# Patient Record
Sex: Female | Born: 2000 | Race: Black or African American | Hispanic: No | Marital: Single | Smoking: Former smoker
Health system: Southern US, Community
[De-identification: ages and names within clinical notes are randomized; demographics above are authoritative.]

## PROBLEM LIST (undated history)

## (undated) DIAGNOSIS — J45909 Unspecified asthma, uncomplicated: Secondary | ICD-10-CM

## (undated) DIAGNOSIS — B009 Herpesviral infection, unspecified: Secondary | ICD-10-CM

## (undated) HISTORY — PX: NO PAST SURGERIES: SHX2092

## (undated) HISTORY — DX: Unspecified asthma, uncomplicated: J45.909

## (undated) HISTORY — DX: Herpesviral infection, unspecified: B00.9

---

## 2019-11-11 ENCOUNTER — Telehealth: Payer: Self-pay | Admitting: Adult Health

## 2019-11-11 NOTE — Telephone Encounter (Signed)
Called patient regarding appointment and the following message was left: ° ° °We have you scheduled for an upcoming appointment at our office. At this time, we are still not allowing visitors during the appointment, however, a support person, over age 20, may accompany you to your appointment if assistance is needed for safety or care concerns. Otherwise, support persons should remain outside until the visit is complete.  ° °We ask if you are sick, have any symptoms of COVID, have had any exposure to anyone suspected or confirmed of having COVID-19, or are awaiting test results for COVID-19, to call our office as we may need to reschedule you for a virtual visit or schedule your appointment for a later date.   ° °Please know we will ask you these questions or similar questions when you arrive for your appointment and understand this is how we are keeping everyone safe.   ° °Also,to keep you safe, please use the provided hand sanitizer when you enter the office. We are asking everyone in the office to wear a mask to help prevent the spread of °germs. If you have a mask of your own, please wear it to your appointment, if not, we are happy to provide one for you. ° °Thank you for understanding and your cooperation.  ° ° °[facility] Staff ° ° ° ° ° °

## 2019-11-12 ENCOUNTER — Ambulatory Visit (INDEPENDENT_AMBULATORY_CARE_PROVIDER_SITE_OTHER): Payer: Medicaid Other | Admitting: Adult Health

## 2019-11-12 ENCOUNTER — Encounter: Payer: Self-pay | Admitting: Adult Health

## 2019-11-12 ENCOUNTER — Other Ambulatory Visit (HOSPITAL_COMMUNITY)
Admission: RE | Admit: 2019-11-12 | Discharge: 2019-11-12 | Disposition: A | Discharge status: Home / Self Care | Payer: Medicaid Other | Source: Ambulatory Visit | Attending: Adult Health | Admitting: Adult Health

## 2019-11-12 ENCOUNTER — Other Ambulatory Visit: Payer: Self-pay

## 2019-11-12 VITALS — BP 120/77 | HR 83 | Ht 62.0 in | Wt 148.0 lb

## 2019-11-12 DIAGNOSIS — Z113 Encounter for screening for infections with a predominantly sexual mode of transmission: Secondary | ICD-10-CM | POA: Diagnosis not present

## 2019-11-12 DIAGNOSIS — N920 Excessive and frequent menstruation with regular cycle: Secondary | ICD-10-CM | POA: Diagnosis not present

## 2019-11-12 DIAGNOSIS — Z7689 Persons encountering health services in other specified circumstances: Secondary | ICD-10-CM | POA: Insufficient documentation

## 2019-11-12 MED ORDER — LO LOESTRIN FE 1 MG-10 MCG / 10 MCG PO TABS: 1.0000 | ORAL_TABLET | Freq: Every day | ORAL | 11 refills | Status: DC

## 2019-11-12 NOTE — Progress Notes (Signed)
Patient ID: Jackie Brennan, female   DOB: 14-Oct-2001, 19 y.o.   MRN: [id number] History of Present Illness: Jackie Brennan is a 19 year old black female, single,G0P0 in requesting STD testing.And mentioned periods heavy     Current Medications, Allergies, Past Medical History, Past Surgical History, Family History and Social History were reviewed in [employer] record.     Review of Systems: Patient denies any headaches, hearing loss, fatigue, blurred vision, shortness of breath, chest pain, abdominal pain, problems with bowel movements, urination, or intercourse. No joint pain or mood swings. Has heavy periods may change every hour and lasts 7 days and has some mild cramps but not bad.     Physical Exam:BP 120/77 (BP Location: Left Arm, Patient Position: Sitting, Cuff Size: Normal)   Pulse 83   Ht 5\' 2"  (1.575 m)   Wt 148 lb (67.1 kg)   LMP 10/25/2019   BMI 27.07 kg/m  General:  Well developed, well nourished, no acute distress Skin:  Warm and dry Neck:  Midline trachea, normal thyroid, good ROM, no lymphadenopathy Lungs; Clear to auscultation bilaterally Cardiovascular: Regular rate and rhythm Pelvic:  External genitalia is normal in appearance, no lesions.  The vagina is normal in appearance. Urethra has no lesions or masses. The cervix is smooth.  Uterus is felt to be normal size, shape, and contour.  No adnexal masses or tenderness noted.Bladder is non tender, no masses felt.CV swab obtained. Extremities/musculoskeletal:  No swelling or varicosities noted, no clubbing or cyanosis Psych:  No mood changes, alert and cooperative,seems happy Fall risk is low PHQ 2 score is 0. Examination chaperoned by 10/27/2019 LPN  Impression and Plan: 1. Screening examination for STD (sexually transmitted disease) CV swab sent for GC/CHL and trich Check HIV and RPR  2. Menorrhagia with regular cycle   3. Encounter for menstrual regulation will rx OCs, start with next  period and use condoms Meds ordered this encounter  Medications  . Norethindrone-Ethinyl Estradiol-Fe Biphas (LO LOESTRIN FE) 1 MG-10 MCG / 10 MCG tablet    Sig: Take 1 tablet by mouth daily. Take 1 daily by mouth    Dispense:  1 Package    Refill:  11    BIN Malachy Mood, PCN CN, GRP [id number]    Order Specific Question:   Supervising Provider    Answer:   [id number]  Follow up in 3 months for ROS

## 2019-11-13 LAB — RPR: RPR Ser Ql: NONREACTIVE

## 2019-11-13 LAB — HIV ANTIBODY (ROUTINE TESTING W REFLEX): HIV Screen 4th Generation wRfx: NONREACTIVE

## 2019-11-16 ENCOUNTER — Telehealth: Payer: Self-pay | Admitting: *Deleted

## 2019-11-16 LAB — CERVICOVAGINAL ANCILLARY ONLY
Chlamydia: NEGATIVE
Comment: NEGATIVE
Comment: NEGATIVE
Comment: NORMAL
Neisseria Gonorrhea: NEGATIVE
Trichomonas: NEGATIVE

## 2019-11-16 NOTE — Telephone Encounter (Signed)
LMOVM that HIV and RPR both negative.

## 2019-11-17 ENCOUNTER — Telehealth: Payer: Self-pay | Admitting: *Deleted

## 2019-11-17 NOTE — Telephone Encounter (Signed)
Left message letting pt know her CV swab was negative. JSY

## 2019-11-17 NOTE — Telephone Encounter (Signed)
-----   Message from Adline Potter, NP sent at 11/17/2019  8:23 AM EST ----- Let pt know CV swab negative

## 2020-02-05 ENCOUNTER — Encounter: Payer: Self-pay | Admitting: Emergency Medicine

## 2020-02-05 ENCOUNTER — Emergency Department
Admission: EM | Admit: 2020-02-05 | Discharge: 2020-02-05 | Disposition: A | Discharge status: Home / Self Care | Payer: Medicaid Other | Attending: Emergency Medicine | Admitting: Emergency Medicine

## 2020-02-05 ENCOUNTER — Other Ambulatory Visit: Payer: Self-pay

## 2020-02-05 DIAGNOSIS — R1013 Epigastric pain: Secondary | ICD-10-CM | POA: Insufficient documentation

## 2020-02-05 DIAGNOSIS — R1084 Generalized abdominal pain: Secondary | ICD-10-CM | POA: Diagnosis present

## 2020-02-05 LAB — COMPREHENSIVE METABOLIC PANEL
ALT: 14 U/L (ref 0–44)
AST: 18 U/L (ref 15–41)
Albumin: 3.8 g/dL (ref 3.5–5.0)
Alkaline Phosphatase: 67 U/L (ref 38–126)
Anion gap: 6 (ref 5–15)
BUN: 15 mg/dL (ref 6–20)
CO2: 26 mmol/L (ref 22–32)
Calcium: 9 mg/dL (ref 8.9–10.3)
Chloride: 104 mmol/L (ref 98–111)
Creatinine, Ser: 0.78 mg/dL (ref 0.44–1.00)
GFR calc Af Amer: >60 mL/min (ref >60)
GFR calc non Af Amer: >60 mL/min (ref >60)
Glucose, Bld: 93 mg/dL (ref 70–99)
Potassium: 4.1 mmol/L (ref 3.5–5.1)
Sodium: 136 mmol/L (ref 135–145)
Total Bilirubin: 0.7 mg/dL (ref 0.3–1.2)
Total Protein: 8 g/dL (ref 6.5–8.1)

## 2020-02-05 LAB — URINALYSIS, COMPLETE (UACMP) WITH MICROSCOPIC
Bacteria, UA: NONE SEEN
Bilirubin Urine: NEGATIVE
Glucose, UA: NEGATIVE mg/dL
Hgb urine dipstick: NEGATIVE
Ketones, ur: NEGATIVE mg/dL
Leukocytes,Ua: NEGATIVE
Nitrite: NEGATIVE
Protein, ur: 30 mg/dL — AB
Specific Gravity, Urine: 1.024 (ref 1.005–1.030)
pH: 9 — ABNORMAL HIGH (ref 5.0–8.0)

## 2020-02-05 LAB — CBC
HCT: 38 % (ref 36.0–46.0)
Hemoglobin: 12.2 g/dL (ref 12.0–15.0)
MCH: 27.5 pg (ref 26.0–34.0)
MCHC: 32.1 g/dL (ref 30.0–36.0)
MCV: 85.6 fL (ref 80.0–100.0)
Platelets: 261 10*3/uL (ref 150–400)
RBC: 4.44 MIL/uL (ref 3.87–5.11)
RDW: 13.6 % (ref 11.5–15.5)
WBC: 5.8 10*3/uL (ref 4.0–10.5)
nRBC: 0 % (ref 0.0–0.2)

## 2020-02-05 LAB — POCT PREGNANCY, URINE: Preg Test, Ur: NEGATIVE

## 2020-02-05 LAB — LIPASE, BLOOD: Lipase: 43 U/L (ref 11–51)

## 2020-02-05 MED ORDER — DICYCLOMINE HCL 10 MG PO CAPS: 10.0000 mg | ORAL_CAPSULE | Freq: Four times a day (QID) | ORAL | 0 refills | Status: DC

## 2020-02-05 MED ORDER — DICYCLOMINE HCL 10 MG PO CAPS
10.0000 mg | ORAL_CAPSULE | Freq: Once | ORAL | Status: AC
  Administered 2020-02-05: 10 mg via ORAL
  Filled 2020-02-05: qty 1

## 2020-02-05 MED ORDER — ONDANSETRON 4 MG PO TBDP
4.0000 mg | ORAL_TABLET | Freq: Once | ORAL | Status: AC
  Administered 2020-02-05: 4 mg via ORAL
  Filled 2020-02-05: qty 1

## 2020-02-05 MED ORDER — SODIUM CHLORIDE 0.9% FLUSH: 3.0000 mL | Freq: Once | INTRAVENOUS | Status: DC

## 2020-02-05 MED ORDER — ONDANSETRON HCL 4 MG PO TABS: 4.0000 mg | ORAL_TABLET | Freq: Three times a day (TID) | ORAL | 0 refills | Status: DC | PRN

## 2020-02-05 NOTE — ED Provider Notes (Signed)
Emergency Department Provider Note  ____________________________________________  Time seen: Approximately 5:45 PM  I have reviewed the triage vital signs and the nursing notes.   HISTORY  Chief Complaint Abdominal Pain   Historian Patient     HPI Jackie Brennan is a 19 y.o. female with an unremarkable past medical history presents to the emergency department with generalized abdominal pain that has occurred over the past several months.  Patient states that she has had one episode of emesis earlier in the week and has had diarrhea over the past several months but not consistently.  Last bowel movement was this morning and was normal.  She denies fever or chills at home.  No dysuria, hematuria or increased urinary frequency.  No pain in her low back.  She states that when pain originally started, it would be worse after eating.  She denies excessive use of alcohol or NSAIDs.  She denies a history of GI issues in childhood.  She states that the pain is episodic and does seem to come and go.  She denies dyspareunia or pelvic pain.   Past Medical History:  Diagnosis Date  . Herpes simplex virus (HSV) infection      Immunizations up to date:  Yes.     Past Medical History:  Diagnosis Date  . Herpes simplex virus (HSV) infection     Patient Active Problem List   Diagnosis Date Noted  . Screening examination for STD (sexually transmitted disease) 11/12/2019  . Menorrhagia with regular cycle 11/12/2019  . Encounter for menstrual regulation 11/12/2019    History reviewed. No pertinent surgical history.  Prior to Admission medications   Medication Sig Start Date End Date Taking? Authorizing Provider  dicyclomine (BENTYL) 10 MG capsule Take 1 capsule (10 mg total) by mouth 4 (four) times daily for 14 days. 02/05/20 02/19/20  Orvil Feil, PA-C  Norethindrone-Ethinyl Estradiol-Fe Biphas (LO LOESTRIN FE) 1 MG-10 MCG / 10 MCG tablet Take 1 tablet by mouth daily. Take 1 daily  by mouth 11/12/19   Adline Potter, NP  ondansetron (ZOFRAN) 4 MG tablet Take 1 tablet (4 mg total) by mouth every 8 (eight) hours as needed for nausea or vomiting. 02/05/20   Orvil Feil, PA-C  valACYclovir (VALTREX) 500 MG tablet Take 500 mg by mouth daily.  06/08/19   [provider]    Allergies Patient has no known allergies.  Family History  Problem Relation Age of Onset  . Diabetes Paternal Grandmother   . Arthritis Paternal Grandmother   . Diabetes Maternal Grandmother        borderline  . Arthritis Maternal Grandfather   . Diabetes Father     Social History Social History   Tobacco Use  . Smoking status: Never Smoker  . Smokeless tobacco: Never Used  Substance Use Topics  . Alcohol use: Never  . Drug use: Never     Review of Systems  Constitutional: No fever/chills Eyes:  No discharge ENT: No upper respiratory complaints. Respiratory: no cough. No SOB/ use of accessory muscles to breath Gastrointestinal:  Patient has generalized abdominal pain.  Musculoskeletal: Negative for musculoskeletal pain. Skin: Negative for rash, abrasions, lacerations, ecchymosis.    ____________________________________________   PHYSICAL EXAM:  VITAL SIGNS: ED Triage Vitals  Enc Vitals Group     BP 02/05/20 1621 128/84     Pulse Rate 02/05/20 1621 79     Resp 02/05/20 1621 18     Temp 02/05/20 1621 98.3 F (36.8 C)  Temp Source 02/05/20 1621 Oral     SpO2 02/05/20 1621 100 %     Weight 02/05/20 1622 145 lb (65.8 kg)     Height 02/05/20 1622 5\' 2"  (1.575 m)     Head Circumference --      Peak Flow --      Pain Score 02/05/20 1621 6     Pain Loc --      Pain Edu? --      Excl. in GC? --      Constitutional: Alert and oriented. Well appearing and in no acute distress. Eyes: Conjunctivae are normal. PERRL. EOMI. Head: Atraumatic. ENT:      Ears: TMs are pearly.       Nose: No congestion/rhinnorhea.      Mouth/Throat: Mucous membranes are moist.   Neck: No stridor.  No cervical spine tenderness to palpation. Cardiovascular: Normal rate, regular rhythm. Normal S1 and S2.  Good peripheral circulation. Respiratory: Normal respiratory effort without tachypnea or retractions. Lungs CTAB. Good air entry to the bases with no decreased or absent breath sounds Gastrointestinal: Bowel sounds x 4 quadrants. Soft and nontender to palpation. No guarding or rigidity. No distention. Musculoskeletal: Full range of motion to all extremities. No obvious deformities noted Neurologic:  Normal for age. No gross focal neurologic deficits are appreciated.  Skin:  Skin is warm, dry and intact. No rash noted. Psychiatric: Mood and affect are normal for age. Speech and behavior are normal.   ____________________________________________   LABS (all labs ordered are listed, but only abnormal results are displayed)  Labs Reviewed  URINALYSIS, COMPLETE (UACMP) WITH MICROSCOPIC - Abnormal; Notable for the following components:      Result Value   Color, Urine YELLOW (*)    APPearance CLEAR (*)    pH 9.0 (*)    Protein, ur 30 (*)    All other components within normal limits  LIPASE, BLOOD  COMPREHENSIVE METABOLIC PANEL  CBC  POC URINE PREG, ED  POCT PREGNANCY, URINE   ____________________________________________  EKG   ____________________________________________  RADIOLOGY     No results found.  ____________________________________________    PROCEDURES  Procedure(s) performed:     Procedures     Medications  sodium chloride flush (NS) 0.9 % injection 3 mL (3 mLs Intravenous Not Given 02/05/20 1913)  dicyclomine (BENTYL) capsule 10 mg (10 mg Oral Given 02/05/20 1827)  ondansetron (ZOFRAN-ODT) disintegrating tablet 4 mg (4 mg Oral Given 02/05/20 1828)     ____________________________________________   INITIAL IMPRESSION / ASSESSMENT AND PLAN / ED COURSE  Pertinent labs & imaging results that were available during my care of  the patient were reviewed by me and considered in my medical decision making (see chart for details).      Assessment and Plan:  Abdominal pain 19 year old female presents to the emergency department with generalized and epigastric abdominal pain that has been going on intermittently for the past 3 months.  Vital signs were reviewed at triage and were reassuring.  On physical exam, abdomen was soft and nontender without guarding.  CBC and CMP were reassuring.  Lipase was within reference range.  Urine pregnancy testing was negative.  Urinalysis was noncontributory for cystitis.  Patient reported that her abdominal discomfort resolved after Bentyl and Zofran were administered.  Patient was discharged with Zofran and Bentyl.  Return precautions were given to return with new or worsening symptoms.  All patient questions were answered.   ____________________________________________  FINAL CLINICAL IMPRESSION(S) / ED DIAGNOSES  Final diagnoses:  Epigastric pain      NEW MEDICATIONS STARTED DURING THIS VISIT:  ED Discharge Orders         Ordered    dicyclomine (BENTYL) 10 MG capsule  4 times daily     02/05/20 1914    ondansetron (ZOFRAN) 4 MG tablet  Every 8 hours PRN     02/05/20 1914              This chart was dictated using voice recognition software/Dragon. Despite best efforts to proofread, errors can occur which can change the meaning. Any change was purely unintentional.     Lannie Fields, PA-C 02/05/20 1926    Earleen Newport, MD 02/05/20 1929

## 2020-02-05 NOTE — ED Notes (Addendum)
See triage note  Presents with generalized abd pain   States she has had the pain for several months  States the pain is always there but changes in intensity  No n/v or fever

## 2020-02-05 NOTE — ED Triage Notes (Signed)
Pt to ER with c/o intermittent LUQ abdominal pain for "months".  States in last few days it has become constant with nothing changing pain.  Pt states 1 episode of vomiting on Wednesday.  States mild diarrhea.

## 2020-02-09 ENCOUNTER — Telehealth: Payer: Self-pay | Admitting: Adult Health

## 2020-02-09 NOTE — Telephone Encounter (Signed)

## 2020-02-10 ENCOUNTER — Ambulatory Visit: Payer: Medicaid Other | Admitting: Adult Health

## 2020-03-03 ENCOUNTER — Other Ambulatory Visit: Payer: Self-pay

## 2020-03-03 ENCOUNTER — Encounter: Payer: Self-pay | Admitting: Gastroenterology

## 2020-03-03 ENCOUNTER — Ambulatory Visit (INDEPENDENT_AMBULATORY_CARE_PROVIDER_SITE_OTHER): Payer: Medicaid Other | Admitting: Gastroenterology

## 2020-03-03 VITALS — BP 111/76 | HR 103 | Temp 98.1°F | Ht 62.0 in | Wt 150.6 lb

## 2020-03-03 DIAGNOSIS — K5909 Other constipation: Secondary | ICD-10-CM | POA: Diagnosis not present

## 2020-03-03 DIAGNOSIS — R1013 Epigastric pain: Secondary | ICD-10-CM | POA: Diagnosis not present

## 2020-03-03 NOTE — Progress Notes (Signed)
Cephas Darby, MD [address]  [place]  [place], [place] [address]  Main: [phone]  Fax: [phone]    Gastroenterology Consultation  Referring Provider:     No ref. provider found Primary Care Physician:  Patient, No Pcp Per Primary Gastroenterologist:  Dr. Cephas Darby Reason for Consultation:     Left upper quadrant pain        HPI:   Jackie Brennan is a 19 y.o. female referred by Dr. Patient, No Pcp Per  for consultation & management of left upper quadrant pain.  Patient went to ER on 02/05/2020 secondary to worsening of epigastric and left upper quadrant that has been ongoing for several months.  She also reports symptoms of nausea, nonbloody emesis as well as loose stools.  Currently, she reports constipation.  In the ER, her labs were unremarkable including CBC, CMP, lipase, UA and urine pregnancy test.  She was discharged on Bentyl.  Patient did not want to take Bentyl until she is seen by GI.  Patient also reports abdominal bloating.  Her weight has been stable.  She denies any other GI symptoms  She does not smoke or drink alcohol, denies marijuana use  NSAIDs: None  Antiplts/Anticoagulants/Anti thrombotics: None  GI Procedures: None She denies family history of IBD, GI malignancy  Past Medical History:  Diagnosis Date  . Herpes simplex virus (HSV) infection     History reviewed. No pertinent surgical history.  Current Outpatient Medications:  .  Norethindrone-Ethinyl Estradiol-Fe Biphas (LO LOESTRIN FE) 1 MG-10 MCG / 10 MCG tablet, Take 1 tablet by mouth daily. Take 1 daily by mouth, Disp: 1 Package, Rfl: 11 .  triamcinolone cream (KENALOG) 0.1 %, , Disp: , Rfl:  .  valACYclovir (VALTREX) 500 MG tablet, Take 500 mg by mouth daily. , Disp: , Rfl:    Family History  Problem Relation Age of Onset  . Diabetes Paternal Grandmother   . Arthritis Paternal Grandmother   . Diabetes Maternal Grandmother        borderline  . Arthritis Maternal  Grandfather   . Diabetes Father      Social History   Tobacco Use  . Smoking status: Never Smoker  . Smokeless tobacco: Never Used  Substance Use Topics  . Alcohol use: Never  . Drug use: Never    Allergies as of 03/03/2020  . (No Known Allergies)    Review of Systems:    All systems reviewed and negative except where noted in HPI.   Physical Exam:  BP 111/76 (BP Location: Left Arm, Patient Position: Sitting, Cuff Size: Normal)   Pulse (!) 103   Temp 98.1 F (36.7 C) (Oral)   Ht 5\' 2"  (1.575 m)   Wt 150 lb 9 oz (68.3 kg)   BMI 27.54 kg/m  No LMP recorded.  General:   Alert,  Well-developed, well-nourished, pleasant and cooperative in NAD Head:  Normocephalic and atraumatic. Eyes:  Sclera clear, no icterus.   Conjunctiva pink. Ears:  Normal auditory acuity. Nose:  No deformity, discharge, or lesions. Mouth:  No deformity or lesions,oropharynx pink & moist. Neck:  Supple; no masses or thyromegaly. Lungs:  Respirations even and unlabored.  Clear throughout to auscultation.   No wheezes, crackles, or rhonchi. No acute distress. Heart:  Regular rate and rhythm; no murmurs, clicks, rubs, or gallops. Abdomen:  Normal bowel sounds. Soft, non-tender and non-distended without masses, hepatosplenomegaly or hernias noted.  No guarding or rebound tenderness.   Rectal: Not performed  Msk:  Symmetrical without gross deformities. Good, equal movement & strength bilaterally. Pulses:  Normal pulses noted. Extremities:  No clubbing or edema.  No cyanosis. Neurologic:  Alert and oriented x3;  grossly normal neurologically. Skin:  Intact without significant lesions or rashes. No jaundice. Psych:  Alert and cooperative. Normal mood and affect.  Imaging Studies: None  Assessment and Plan:   Jackie Brennan is a 19 y.o. female with no significant past medical history is seen in consultation for chronic upper abdominal pain associated with intermittent nausea, altered bowel habits  between diarrhea and constipation  Recommend H. pylori breath test Discussed about high-fiber diet to manage constipation   Follow up in 6 weeks   Arlyss Repress, MD

## 2020-03-03 NOTE — Patient Instructions (Signed)

## 2020-03-05 LAB — H. PYLORI BREATH TEST: H pylori Breath Test: NEGATIVE

## 2020-03-08 ENCOUNTER — Telehealth: Payer: Self-pay

## 2020-03-08 MED ORDER — OMEPRAZOLE 40 MG PO CPDR: 40.0000 mg | DELAYED_RELEASE_CAPSULE | Freq: Every day | ORAL | 0 refills | Status: DC

## 2020-03-08 NOTE — Telephone Encounter (Signed)
Patient states she still has the upper abdominal pain but is some better. She would like to try the omeprazole sent to the pharmacy

## 2020-03-08 NOTE — Telephone Encounter (Signed)
-----   Message from Toney Reil, MD sent at 03/07/2020  5:54 PM EDT ----- H. pylori breath test came back negative.  If she still has ongoing upper abdominal pain pain, recommend trial of omeprazole 40 mg once a day before breakfast  RV

## 2020-03-08 NOTE — Telephone Encounter (Signed)
Called and left a message for call back  

## 2020-04-26 ENCOUNTER — Ambulatory Visit: Payer: Medicaid Other | Admitting: Gastroenterology

## 2020-04-29 ENCOUNTER — Telehealth: Payer: Self-pay | Admitting: Adult Health

## 2020-04-29 NOTE — Telephone Encounter (Signed)
Called patient regarding appointment and the following message was left:   Updated visitor policy: We are now allowing one support person with you during your upcoming visit.  However, we do ask that they wear a mask and will also be screened at check-in.   We ask if you are sick, have any symptoms of COVID, have had any exposure to anyone suspected or confirmed of having COVID-19, or are awaiting test results for COVID-19, to call our office as we may need to reschedule you for a virtual visit or schedule your appointment for a later date.    Please know we will ask you these questions or similar questions when you arrive for your appointment and understand this is how we are keeping everyone safe.    Also,to keep you safe, please use the provided hand sanitizer when you enter the office. We are asking everyone in the office to wear a mask to help prevent the spread of germs. If you have a mask of your own, please wear it to your appointment, if not, we are happy to provide one for you.  Thank you for understanding and your cooperation.    [facility] Staff      

## 2020-05-02 ENCOUNTER — Ambulatory Visit: Payer: Medicaid Other | Admitting: Adult Health

## 2020-05-03 ENCOUNTER — Ambulatory Visit: Payer: Medicaid Other | Admitting: Adult Health

## 2020-06-06 ENCOUNTER — Encounter: Payer: Self-pay | Admitting: *Deleted

## 2020-06-06 ENCOUNTER — Other Ambulatory Visit: Payer: Self-pay

## 2020-06-06 DIAGNOSIS — N390 Urinary tract infection, site not specified: Secondary | ICD-10-CM | POA: Insufficient documentation

## 2020-06-06 DIAGNOSIS — R102 Pelvic and perineal pain: Secondary | ICD-10-CM | POA: Diagnosis present

## 2020-06-06 DIAGNOSIS — R319 Hematuria, unspecified: Secondary | ICD-10-CM | POA: Insufficient documentation

## 2020-06-06 DIAGNOSIS — Z79899 Other long term (current) drug therapy: Secondary | ICD-10-CM | POA: Insufficient documentation

## 2020-06-06 LAB — COMPREHENSIVE METABOLIC PANEL
ALT: 10 U/L (ref 0–44)
AST: 17 U/L (ref 15–41)
Albumin: 4.3 g/dL (ref 3.5–5.0)
Alkaline Phosphatase: 70 U/L (ref 38–126)
Anion gap: 9 (ref 5–15)
BUN: 8 mg/dL (ref 6–20)
CO2: 23 mmol/L (ref 22–32)
Calcium: 9.2 mg/dL (ref 8.9–10.3)
Chloride: 102 mmol/L (ref 98–111)
Creatinine, Ser: 0.77 mg/dL (ref 0.44–1.00)
GFR calc Af Amer: >60 mL/min (ref >60)
GFR calc non Af Amer: >60 mL/min (ref >60)
Glucose, Bld: 115 mg/dL — ABNORMAL HIGH (ref 70–99)
Potassium: 4 mmol/L (ref 3.5–5.1)
Sodium: 134 mmol/L — ABNORMAL LOW (ref 135–145)
Total Bilirubin: 0.9 mg/dL (ref 0.3–1.2)
Total Protein: 8.4 g/dL — ABNORMAL HIGH (ref 6.5–8.1)

## 2020-06-06 LAB — URINALYSIS, COMPLETE (UACMP) WITH MICROSCOPIC
Bilirubin Urine: NEGATIVE
Glucose, UA: NEGATIVE mg/dL
Ketones, ur: NEGATIVE mg/dL
Nitrite: POSITIVE — AB
Protein, ur: 30 mg/dL — AB
RBC / HPF: >50 RBC/hpf — ABNORMAL HIGH (ref 0–5)
Specific Gravity, Urine: 1.016 (ref 1.005–1.030)
WBC, UA: >50 WBC/hpf — ABNORMAL HIGH (ref 0–5)
pH: 5 (ref 5.0–8.0)

## 2020-06-06 LAB — POCT PREGNANCY, URINE: Preg Test, Ur: NEGATIVE

## 2020-06-06 LAB — CBC
HCT: 37.6 % (ref 36.0–46.0)
Hemoglobin: 13.1 g/dL (ref 12.0–15.0)
MCH: 28.5 pg (ref 26.0–34.0)
MCHC: 34.8 g/dL (ref 30.0–36.0)
MCV: 81.7 fL (ref 80.0–100.0)
Platelets: 252 10*3/uL (ref 150–400)
RBC: 4.6 MIL/uL (ref 3.87–5.11)
RDW: 13.4 % (ref 11.5–15.5)
WBC: 7.3 10*3/uL (ref 4.0–10.5)
nRBC: 0 % (ref 0.0–0.2)

## 2020-06-06 LAB — LIPASE, BLOOD: Lipase: 30 U/L (ref 11–51)

## 2020-06-06 MED ORDER — SODIUM CHLORIDE 0.9% FLUSH: 3.0000 mL | Freq: Once | INTRAVENOUS | Status: DC

## 2020-06-06 NOTE — ED Triage Notes (Signed)
Pt has low abd pain.  Diarrhea x 5.  No vomiting.  Pt also has urinary frequency and pressure.  No back pain.  No vag bleeding.  No vag discharge.  Pt alert.

## 2020-06-07 ENCOUNTER — Emergency Department
Admission: EM | Admit: 2020-06-07 | Discharge: 2020-06-07 | Disposition: A | Discharge status: Home / Self Care | Payer: Medicaid Other | Attending: Emergency Medicine | Admitting: Emergency Medicine

## 2020-06-07 DIAGNOSIS — N390 Urinary tract infection, site not specified: Secondary | ICD-10-CM

## 2020-06-07 LAB — WET PREP, GENITAL
Clue Cells Wet Prep HPF POC: NONE SEEN
Sperm: NONE SEEN
Trich, Wet Prep: NONE SEEN
Yeast Wet Prep HPF POC: NONE SEEN

## 2020-06-07 LAB — CHLAMYDIA/NGC RT PCR (ARMC ONLY)
Chlamydia Tr: NOT DETECTED
N gonorrhoeae: NOT DETECTED

## 2020-06-07 MED ORDER — CEPHALEXIN 500 MG PO CAPS: 500.0000 mg | ORAL_CAPSULE | Freq: Two times a day (BID) | ORAL | 0 refills | Status: DC

## 2020-06-07 NOTE — ED Notes (Signed)
Pt in room with lights off, tv on, pt in bed with call bell at bedside

## 2020-06-07 NOTE — ED Notes (Signed)
Pt asleep in bed at this time, lights off and sitting in bed

## 2020-06-07 NOTE — ED Notes (Signed)
MD to bedside.

## 2020-06-07 NOTE — ED Provider Notes (Signed)
[facility] Emergency Department Provider Note  ____________________________________________   First MD Initiated Contact with Patient 06/07/20 [phone]     (approximate)  I have reviewed the triage vital signs and the nursing notes.   HISTORY  Chief Complaint Abdominal Pain and Urinary Frequency    HPI Jackie Brennan is a 19 y.o. female who presents for evaluation of increased urinary frequency and pressure as well as some dull aching pain in her pelvis.  She has had this for an extended period of time (at least the aching pain), but the urinary frequency has increased dramatically recently.  She has no burning when she urinates and has not seen any blood in her urine.  She reports that the symptoms are severe and have gradually worsened over time.  Nothing in particular seems to make it better or worse.  She is sexually active with one partner and they do not use condoms.  It is a relatively new relationship over the last couple of months.  She denies fever/chills, chest pain, shortness of breath, cough, nausea, and vomiting.         Past Medical History:  Diagnosis Date  . Herpes simplex virus (HSV) infection     Patient Active Problem List   Diagnosis Date Noted  . Screening examination for STD (sexually transmitted disease) 11/12/2019  . Menorrhagia with regular cycle 11/12/2019  . Encounter for menstrual regulation 11/12/2019  . Acne 12/05/2010    No past surgical history on file.  Prior to Admission medications   Medication Sig Start Date End Date Taking? Authorizing Provider  cephALEXin (KEFLEX) 500 MG capsule Take 1 capsule (500 mg total) by mouth 2 (two) times daily. 06/07/20   Loleta Rose, MD  Norethindrone-Ethinyl Estradiol-Fe Biphas (LO LOESTRIN FE) 1 MG-10 MCG / 10 MCG tablet Take 1 tablet by mouth daily. Take 1 daily by mouth 11/12/19   Adline Potter, NP  omeprazole (PRILOSEC) 40 MG capsule Take 1 capsule (40 mg total) by mouth  daily. 03/08/20   Toney Reil, MD  triamcinolone cream (KENALOG) 0.1 %  09/15/19   [provider]  valACYclovir (VALTREX) 500 MG tablet Take 500 mg by mouth daily.  06/08/19   [provider]    Allergies Patient has no known allergies.  Family History  Problem Relation Age of Onset  . Diabetes Paternal Grandmother   . Arthritis Paternal Grandmother   . Diabetes Maternal Grandmother        borderline  . Arthritis Maternal Grandfather   . Diabetes Father     Social History Social History   Tobacco Use  . Smoking status: Never Smoker  . Smokeless tobacco: Never Used  Vaping Use  . Vaping Use: Never used  Substance Use Topics  . Alcohol use: Never  . Drug use: Never    Review of Systems Constitutional: No fever/chills Eyes: No visual changes. ENT: No sore throat. Cardiovascular: Denies chest pain. Respiratory: Denies shortness of breath. Gastrointestinal: Dull lower abdominal discomfort.  No nausea, no vomiting.  No diarrhea.  No constipation. Genitourinary: Negative for dysuria but substantially increased urinary frequency. Musculoskeletal: Negative for neck pain.  Negative for back pain. Integumentary: Negative for rash. Neurological: Negative for headaches, focal weakness or numbness.   ____________________________________________   PHYSICAL EXAM:  VITAL SIGNS: ED Triage Vitals  Enc Vitals Group     BP 06/06/20 2202 123/81     Pulse Rate 06/06/20 2202 97     Resp 06/06/20 2202 18  Temp 06/06/20 2202 98.4 F (36.9 C)     Temp Source 06/06/20 2202 Oral     SpO2 06/06/20 2202 99 %     Weight 06/06/20 2159 67.1 kg (148 lb)     Height 06/06/20 2159 1.575 m (5\' 2" )     Head Circumference --      Peak Flow --      Pain Score 06/06/20 2159 8     Pain Loc --      Pain Edu? --      Excl. in GC? --     Constitutional: Alert and oriented.  Eyes: Conjunctivae are normal.  Head: Atraumatic. Nose: No  congestion/rhinnorhea. Mouth/Throat: Patient is wearing a mask. Neck: No stridor.  No meningeal signs.   Cardiovascular: Normal rate, regular rhythm. Good peripheral circulation. Grossly normal heart sounds. Respiratory: Normal respiratory effort.  No retractions. Gastrointestinal: Soft and nontender. No distention.  Genitourinary: Patient declines pelvic exam.  She self swab the wet prep. Musculoskeletal: No lower extremity tenderness nor edema. No gross deformities of extremities. Neurologic:  Normal speech and language. No gross focal neurologic deficits are appreciated.  Skin:  Skin is warm, dry and intact. Psychiatric: Mood and affect are normal. Speech and behavior are normal.  ____________________________________________   LABS (all labs ordered are listed, but only abnormal results are displayed)  Labs Reviewed  WET PREP, GENITAL - Abnormal; Notable for the following components:      Result Value   WBC, Wet Prep HPF POC MODERATE (*)    All other components within normal limits  COMPREHENSIVE METABOLIC PANEL - Abnormal; Notable for the following components:   Sodium 134 (*)    Glucose, Bld 115 (*)    Total Protein 8.4 (*)    All other components within normal limits  URINALYSIS, COMPLETE (UACMP) WITH MICROSCOPIC - Abnormal; Notable for the following components:   Color, Urine YELLOW (*)    APPearance CLOUDY (*)    Hgb urine dipstick LARGE (*)    Protein, ur 30 (*)    Nitrite POSITIVE (*)    Leukocytes,Ua LARGE (*)    RBC / HPF >50 (*)    WBC, UA >50 (*)    Bacteria, UA RARE (*)    All other components within normal limits  CHLAMYDIA/NGC RT PCR ([facility] ONLY)  URINE CULTURE  LIPASE, BLOOD  CBC  POC URINE PREG, ED  POCT PREGNANCY, URINE   ____________________________________________  EKG  No indication for EKG ____________________________________________  RADIOLOGY I, 2160, personally viewed and evaluated these images (plain radiographs) as  part of my medical decision making, as well as reviewing the written report by the radiologist.  ED MD interpretation: No indication for emergent imaging  Official radiology report(s): No results found.  ____________________________________________   PROCEDURES   Procedure(s) performed (including Critical Care):  Procedures   ____________________________________________   INITIAL IMPRESSION / MDM / ASSESSMENT AND PLAN / ED COURSE  As part of my medical decision making, I reviewed the following data within the electronic MEDICAL RECORD NUMBER Nursing notes reviewed and incorporated, Labs reviewed  and Notes from prior ED visits   Differential diagnosis includes, but is not limited to, urinary tract infection, STD/PID, TOA, bacterial vaginosis.  Patient is generally well-appearing in no distress.  Symptoms seem to been going on for a long time.  She has unprotected sex with a relatively new partner.  We discussed doing a pelvic exam but her preference is to avoid it for now.  I confirmed  that she provided a "dirty" urine specimen so I am adding on a GC chlamydia to her urine and she will obtain her own wet prep sample as is her preference.  Urinalysis is grossly positive and I added on a urine culture.  Comprehensive metabolic panel and CBC are essentially normal.  Anticipate discharge and outpatient follow-up after appropriate treatment for UTI and/or STD.       Clinical Course as of Jun 08 739  Tue Jun 07, 2020  0511 Normal wet prep other than some WBCs  Wet prep, genital(!) [CF]  [phone]  (Note that documentation was delayed due to multiple ED patients requiring immediate care.)  GC/chlamydia and wet prep were both negative.  Patient is comfortable with the plan for discharge and outpatient follow-up.  I wrote a prescription for Keflex 500 mg twice daily x7 days as per infectious disease recommendations and I gave my usual and customary return precautions.   [CF]    Clinical  Course User Index [CF] Loleta Rose, MD     ____________________________________________  FINAL CLINICAL IMPRESSION(S) / ED DIAGNOSES  Final diagnoses:  Urinary tract infection with hematuria, site unspecified     MEDICATIONS GIVEN DURING THIS VISIT:  Medications - No data to display   ED Discharge Orders         Ordered    cephALEXin (KEFLEX) 500 MG capsule  2 times daily     Discontinue  Reprint     06/07/20 0707          *Please note:  Jackie Brennan was evaluated in Emergency Department on 06/07/2020 for the symptoms described in the history of present illness. She was evaluated in the context of the global COVID-19 pandemic, which necessitated consideration that the patient might be at risk for infection with the SARS-CoV-2 virus that causes COVID-19. Institutional protocols and algorithms that pertain to the evaluation of patients at risk for COVID-19 are in a state of rapid change based on information released by regulatory bodies including the CDC and federal and state organizations. These policies and algorithms were followed during the patient's care in the ED.  Some ED evaluations and interventions may be delayed as a result of limited staffing during and after the pandemic.*  Note:  This document was prepared using Dragon voice recognition software and may include unintentional dictation errors.   Loleta Rose, MD 06/07/20 [phone]

## 2020-06-07 NOTE — ED Notes (Signed)
Pt reports lower abdomen pain, frequent urination every 20 minutes, and pressure following urination with a feeling of still needing to go. States she is sexually active with a boyfriend at this time, no protection is used

## 2020-06-09 ENCOUNTER — Encounter: Payer: Self-pay | Admitting: Adult Health

## 2020-06-09 ENCOUNTER — Other Ambulatory Visit: Payer: Self-pay

## 2020-06-09 ENCOUNTER — Ambulatory Visit (INDEPENDENT_AMBULATORY_CARE_PROVIDER_SITE_OTHER): Payer: Medicaid Other | Admitting: Adult Health

## 2020-06-09 ENCOUNTER — Other Ambulatory Visit (HOSPITAL_COMMUNITY)
Admission: RE | Admit: 2020-06-09 | Discharge: 2020-06-09 | Disposition: A | Discharge status: Home / Self Care | Payer: Medicaid Other | Source: Ambulatory Visit | Attending: Adult Health | Admitting: Adult Health

## 2020-06-09 VITALS — BP 118/72 | HR 92 | Ht 62.0 in | Wt 151.0 lb

## 2020-06-09 DIAGNOSIS — Z113 Encounter for screening for infections with a predominantly sexual mode of transmission: Secondary | ICD-10-CM | POA: Diagnosis not present

## 2020-06-09 DIAGNOSIS — Z3009 Encounter for other general counseling and advice on contraception: Secondary | ICD-10-CM

## 2020-06-09 DIAGNOSIS — Z3202 Encounter for pregnancy test, result negative: Secondary | ICD-10-CM

## 2020-06-09 LAB — URINE CULTURE
Culture: >=100000 — AB
Special Requests: NORMAL

## 2020-06-09 LAB — POC URINE PREG, ED: Preg Test, Ur: NEGATIVE

## 2020-06-09 LAB — POCT URINE PREGNANCY: Preg Test, Ur: NEGATIVE

## 2020-06-09 MED ORDER — VALACYCLOVIR HCL 500 MG PO TABS: 500.0000 mg | ORAL_TABLET | Freq: Every day | ORAL | 12 refills | Status: DC

## 2020-06-09 NOTE — Progress Notes (Signed)
  Subjective:     Patient ID: Jackie Brennan, female   DOB: 2001-04-07, 19 y.o.   MRN: [id number]  HPI Tashera is a 19 year old black female, single, G0P0 in requesting STD testing, and refill on valtrex.  She has not had COVID vaccine yet, but is thinking about it. She stopped OCs, had heavy bleeding and increased acne.  No Current PCP.   Review of Systems Denies any discharge, itching or burning Reviewed past medical,surgical, social and family history. Reviewed medications and allergies.     Objective:   Physical Exam BP 118/72 (BP Location: Left Arm, Patient Position: Sitting, Cuff Size: Normal)   Pulse 92   Ht 5\' 2"  (1.575 m)   Wt 151 lb (68.5 kg)   LMP 05/07/2020 (Exact Date)   BMI 27.62 kg/m UPT negative. Skin warm and dry.Pelvic: external genitalia is normal in appearance no lesions, vagina: white discharge without odor,urethra has no lesions or masses noted, cervix:smooth, uterus: normal size, shape and contour, non tender, no masses felt, adnexa: no masses or tenderness noted. Bladder is non tender and no masses felt. CV swab obtained  Upstream - 06/09/20 0953      Pregnancy Intention Screening   Does the patient want to become pregnant in the next year? No    Does the patient's partner want to become pregnant in the next year? No    Would the patient like to discuss contraceptive options today? Yes      Contraception Wrap Up   Current Method No Method - Other Reason    End Method Female Condom    Contraception Counseling Provided Yes          Examination chaperoned by 08/09/20 LPN    Assessment:     1. Urine pregnancy test negative   2. Screening examination for STD (sexually transmitted disease) CV swab sent for GC/CHL and trich Check HIV,RPR and hepatitis C antibody  3. General counseling and advice on contraceptive management Use condoms, until decides on birth control Discussed Plan B     Plan:     Follow up prn

## 2020-06-10 ENCOUNTER — Telehealth: Payer: Self-pay | Admitting: *Deleted

## 2020-06-10 LAB — CERVICOVAGINAL ANCILLARY ONLY
Chlamydia: NEGATIVE
Comment: NEGATIVE
Comment: NEGATIVE
Comment: NORMAL
Neisseria Gonorrhea: NEGATIVE
Trichomonas: NEGATIVE

## 2020-06-10 LAB — HIV ANTIBODY (ROUTINE TESTING W REFLEX): HIV Screen 4th Generation wRfx: NONREACTIVE

## 2020-06-10 LAB — HEPATITIS C ANTIBODY: Hep C Virus Ab: <0.1 s/co ratio (ref 0.0–0.9)

## 2020-06-10 LAB — RPR: RPR Ser Ql: NONREACTIVE

## 2020-06-10 NOTE — Telephone Encounter (Signed)
Telephoned patient at home number and left message to return call.  

## 2020-06-27 ENCOUNTER — Telehealth: Payer: Self-pay | Admitting: *Deleted

## 2020-06-27 NOTE — Telephone Encounter (Signed)
Telephoned patient at home number and advised patient all tests were negative.

## 2020-10-21 ENCOUNTER — Telehealth: Payer: Self-pay | Admitting: Gastroenterology

## 2020-10-21 MED ORDER — OMEPRAZOLE 40 MG PO CPDR: 40.0000 mg | DELAYED_RELEASE_CAPSULE | Freq: Every day | ORAL | 1 refills | Status: DC

## 2020-10-21 NOTE — Telephone Encounter (Signed)
Patient states she is having abdominal pain that is epigastric. She states the pain is constant. She states she has no more refills of the omeprazole 40mg  daily and she states she wants a refill on medication. Made follow up appointment for 12/07/2020

## 2020-10-21 NOTE — Telephone Encounter (Signed)
Questions regarding her medication- pt still having stomach pain

## 2020-10-21 NOTE — Telephone Encounter (Signed)
Per Dr. Allegra Lai it is okay to refill her omeprazole

## 2020-11-12 ENCOUNTER — Other Ambulatory Visit: Payer: Self-pay

## 2020-11-12 ENCOUNTER — Emergency Department
Admission: EM | Admit: 2020-11-12 | Discharge: 2020-11-13 | Disposition: A | Discharge status: Home / Self Care | Payer: Medicaid Other | Attending: Emergency Medicine | Admitting: Emergency Medicine

## 2020-11-12 DIAGNOSIS — G43909 Migraine, unspecified, not intractable, without status migrainosus: Secondary | ICD-10-CM | POA: Insufficient documentation

## 2020-11-12 DIAGNOSIS — Z5321 Procedure and treatment not carried out due to patient leaving prior to being seen by health care provider: Secondary | ICD-10-CM | POA: Diagnosis not present

## 2020-11-12 NOTE — ED Triage Notes (Signed)
Pt arrives poc w cc of throbbing migraines constantly all day. Reports "when I go to sleep and wake up it's still there". Also reports feeling hot and cold all day. Hx migraines.

## 2020-11-13 NOTE — ED Notes (Signed)
Called for vital sign recheck. No answer. Pt not in lobby

## 2020-11-13 NOTE — ED Notes (Signed)
Pt called x 3 for vital signs update. No answer, pt not visualized in the lobby

## 2020-11-13 NOTE — ED Notes (Signed)
Called for vital signs, no answer, pt not in lobby

## 2020-12-07 ENCOUNTER — Ambulatory Visit: Payer: Medicaid Other | Admitting: Gastroenterology

## 2021-01-13 ENCOUNTER — Other Ambulatory Visit: Payer: Self-pay | Admitting: Gastroenterology

## 2021-09-20 ENCOUNTER — Emergency Department: Payer: Medicaid Other

## 2021-09-20 ENCOUNTER — Other Ambulatory Visit: Payer: Self-pay

## 2021-09-20 ENCOUNTER — Emergency Department
Admission: EM | Admit: 2021-09-20 | Discharge: 2021-09-20 | Disposition: A | Discharge status: Home / Self Care | Payer: Medicaid Other | Attending: Emergency Medicine | Admitting: Emergency Medicine

## 2021-09-20 DIAGNOSIS — S060X0A Concussion without loss of consciousness, initial encounter: Secondary | ICD-10-CM | POA: Insufficient documentation

## 2021-09-20 DIAGNOSIS — S0003XA Contusion of scalp, initial encounter: Secondary | ICD-10-CM

## 2021-09-20 DIAGNOSIS — S0990XA Unspecified injury of head, initial encounter: Secondary | ICD-10-CM

## 2021-09-20 DIAGNOSIS — S61011A Laceration without foreign body of right thumb without damage to nail, initial encounter: Secondary | ICD-10-CM | POA: Diagnosis not present

## 2021-09-20 LAB — POC URINE PREG, ED: Preg Test, Ur: POSITIVE — AB

## 2021-09-20 MED ORDER — BACITRACIN-NEOMYCIN-POLYMYXIN OINTMENT TUBE
TOPICAL_OINTMENT | Freq: Once | CUTANEOUS | Status: DC
  Filled 2021-09-20: qty 14.17

## 2021-09-20 MED ORDER — LIDOCAINE HCL (PF) 1 % IJ SOLN
INTRAMUSCULAR | Status: AC
  Filled 2021-09-20: qty 5
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
## 2021-09-20 NOTE — ED Notes (Signed)
Wound cleaned with sterile normal saline, wound is open, granulating tissue noted in the wound bed, dr bradler aware
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
## 2021-10-16 ENCOUNTER — Other Ambulatory Visit: Payer: Self-pay

## 2021-10-16 ENCOUNTER — Inpatient Hospital Stay (HOSPITAL_COMMUNITY): Payer: Medicaid Other

## 2021-10-16 ENCOUNTER — Encounter (HOSPITAL_COMMUNITY): Payer: Self-pay | Admitting: Obstetrics & Gynecology

## 2021-10-16 ENCOUNTER — Inpatient Hospital Stay (HOSPITAL_COMMUNITY)
Admission: AD | Admit: 2021-10-16 | Discharge: 2021-10-16 | Disposition: A | Discharge status: Home / Self Care | Payer: Medicaid Other | Attending: Obstetrics & Gynecology | Admitting: Obstetrics & Gynecology

## 2021-10-16 DIAGNOSIS — O209 Hemorrhage in early pregnancy, unspecified: Secondary | ICD-10-CM | POA: Diagnosis not present

## 2021-10-16 DIAGNOSIS — O26891 Other specified pregnancy related conditions, first trimester: Secondary | ICD-10-CM | POA: Diagnosis not present

## 2021-10-16 DIAGNOSIS — O0931 Supervision of pregnancy with insufficient antenatal care, first trimester: Secondary | ICD-10-CM | POA: Diagnosis not present

## 2021-10-16 DIAGNOSIS — Z3A01 Less than 8 weeks gestation of pregnancy: Secondary | ICD-10-CM | POA: Insufficient documentation

## 2021-10-16 DIAGNOSIS — R109 Unspecified abdominal pain: Secondary | ICD-10-CM | POA: Insufficient documentation

## 2021-10-16 DIAGNOSIS — Z3491 Encounter for supervision of normal pregnancy, unspecified, first trimester: Secondary | ICD-10-CM

## 2021-10-16 LAB — ABO/RH: ABO/RH(D): B POS

## 2021-10-16 LAB — CBC
HCT: 31.4 % — ABNORMAL LOW (ref 36.0–46.0)
Hemoglobin: 10.5 g/dL — ABNORMAL LOW (ref 12.0–15.0)
MCH: 27.1 pg (ref 26.0–34.0)
MCHC: 33.4 g/dL (ref 30.0–36.0)
MCV: 81.1 fL (ref 80.0–100.0)
Platelets: 270 10*3/uL (ref 150–400)
RBC: 3.87 MIL/uL (ref 3.87–5.11)
RDW: 13.8 % (ref 11.5–15.5)
WBC: 7.5 10*3/uL (ref 4.0–10.5)
nRBC: 0 % (ref 0.0–0.2)

## 2021-10-16 LAB — HCG, QUANTITATIVE, PREGNANCY: hCG, Beta Chain, Quant, S: 107942 m[IU]/mL — ABNORMAL HIGH (ref <5)

## 2021-10-16 MED ORDER — PREPLUS 27-1 MG PO TABS
1.0000 | ORAL_TABLET | Freq: Every day | ORAL | 13 refills | Status: DC
Start: 2021-10-16 — End: 2021-11-24

## 2021-10-16 NOTE — MAU Note (Signed)
This morning about 0630 I saw bright red blood when I wiped. About 12n I saw more bleeding when I went to the BR. Now bleeding is more like spotting. Having some lower abd cramping.

## 2021-10-16 NOTE — MAU Provider Note (Signed)
Chief Complaint:  Abdominal Pain and Vaginal Bleeding   HPI: Jackie Brennan is a 20 y.o. G1P0000 at [redacted]w[redacted]d who presents to maternity admissions reporting an episode of vaginal bleeding at 0630 with very mild cramping. She saw bright red blood when wiping, then again at noon after voiding. Now her bleeding is more like spotting but the cramping has subsided. LMP is 07/25/21 so she thinks she is about [redacted]wks along. No other physical complaints.   Pregnancy Course: Has not started prenatal care, needs to know who accepts Medicaid.  Past Medical History:  Diagnosis Date   Herpes simplex virus (HSV) infection    OB History  Gravida Para Term Preterm AB Living  1 0 0 0 0 0  SAB IAB Ectopic Multiple Live Births  0 0 0 0 0    # Outcome Date GA Lbr Len/2nd Weight Sex Delivery Anes PTL Lv  1 Current            History reviewed. No pertinent surgical history. Family History  Problem Relation Age of Onset   Diabetes Paternal Grandmother    Arthritis Paternal Grandmother    Diabetes Maternal Grandmother        borderline   Arthritis Maternal Grandfather    Diabetes Father    Social History   Tobacco Use   Smoking status: Never   Smokeless tobacco: Never  Vaping Use   Vaping Use: Never used  Substance Use Topics   Alcohol use: Never   Drug use: Never   No Known Allergies Medications Prior to Admission  Medication Sig Dispense Refill Last Dose   valACYclovir (VALTREX) 500 MG tablet Take 1 tablet (500 mg total) by mouth daily. 30 tablet 12 Past Month   cephALEXin (KEFLEX) 500 MG capsule Take 1 capsule (500 mg total) by mouth 2 (two) times daily. 14 capsule 0    omeprazole (PRILOSEC) 40 MG capsule Take 1 capsule (40 mg total) by mouth daily. 30 capsule 1    I have reviewed patient's Past Medical Hx, Surgical Hx, Family Hx, Social Hx, medications and allergies.   ROS:  Review of Systems  Constitutional:  Negative for fatigue and fever.  HENT:  Negative for congestion and sore throat.    Eyes:  Negative for visual disturbance.  Respiratory:  Negative for cough and shortness of breath.   Gastrointestinal:  Positive for abdominal pain (mild cramping earlier). Negative for nausea and vomiting.  Genitourinary:  Positive for vaginal bleeding (down to spotting). Negative for vaginal discharge.  Musculoskeletal:  Negative for back pain.  Neurological:  Negative for headaches.   Physical Exam  Patient Vitals for the past 24 hrs:  BP Temp Pulse Resp SpO2 Height Weight  10/16/21 2323 123/68 -- 80 16 -- -- --  10/16/21 2117 116/64 -- -- -- -- -- --  10/16/21 2115 -- 98.3 F (36.8 C) 77 16 100 % 5\' 2"  (1.575 m) 159 lb (72.1 kg)   Constitutional: Well-developed, well-nourished female in no acute distress.  Cardiovascular: normal rate & rhythm Respiratory: normal effort, warm and well perfused GI: Abd soft, non-tender MS: Extremities nontender, no edema, normal ROM Neurologic: Alert and oriented x 4.  GU: no CVA tenderness Pelvic: exam deferred, sent to U/S   Labs: Results for orders placed or performed during the hospital encounter of 10/16/21 (from the past 24 hour(s))  CBC     Status: Abnormal   Collection Time: 10/16/21 10:21 PM  Result Value Ref Range   WBC 7.5 4.0 - 10.5  K/uL   RBC 3.87 3.87 - 5.11 MIL/uL   Hemoglobin 10.5 (L) 12.0 - 15.0 g/dL   HCT 31.4 (L) 36.0 - 46.0 %   MCV 81.1 80.0 - 100.0 fL   MCH 27.1 26.0 - 34.0 pg   MCHC 33.4 30.0 - 36.0 g/dL   RDW 13.8 11.5 - 15.5 %   Platelets 270 150 - 400 K/uL   nRBC 0.0 0.0 - 0.2 %  ABO/Rh     Status: None   Collection Time: 10/16/21 10:21 PM  Result Value Ref Range   ABO/RH(D) B POS    No rh immune globuloin      NOT A RH IMMUNE GLOBULIN CANDIDATE, PT RH POSITIVE Performed at [facility] [address], [place], [place] [address]   hCG, quantitative, pregnancy     Status: Abnormal   Collection Time: 10/16/21 10:21 PM  Result Value Ref Range   hCG, Beta Chain, Quant, Chauncey Cruel [id number] (H) <5 mIU/mL    Imaging:  US OB Comp Less 14 Wks  Result Date: 10/16/2021 CLINICAL DATA:  Spotting EXAM: OBSTETRIC <14 WK ULTRASOUND TECHNIQUE: Transabdominal ultrasound was performed for evaluation of the gestation as well as the maternal uterus and adnexal regions. COMPARISON:  None. FINDINGS: Intrauterine gestational sac: Single Yolk sac:  Visualized. Embryo:  Visualized. Cardiac Activity: Visualized. Heart Rate: 169 bpm MSD:    mm    w     d CRL:   15.5 mm   7 w 6 d                  [place] EDC: 05/29/2022 Subchorionic hemorrhage:  None visualized. Maternal uterus/adnexae: No adnexal mass or free fluid. IMPRESSION: Seven week 6 day intrauterine pregnancy. Fetal heart rate 169 beats per minute. No acute maternal findings. Electronically Signed   By: Rolm Baptise M.D.   On: 10/16/2021 22:47    MAU Course: Orders Placed This Encounter  Procedures   US OB Comp Less 14 Wks   Urinalysis, Routine w reflex microscopic Urine, Clean Catch   CBC   hCG, quantitative, pregnancy   ABO/Rh   Discharge patient   Meds ordered this encounter  Medications   Prenatal Vit-Fe Fumarate-FA (PREPLUS) 27-1 MG TABS    Sig: Take 1 tablet by mouth daily.    Dispense:  30 tablet    Refill:  13    Order Specific Question:   Supervising Provider    Answer:   Merrily Pew   MDM: Pt given results and picture of 7wk pregnancy. Discussed possible reasons for first trimester bleeding. List of offices that accept Medicaid in AVS and pt encouraged to start care. PNVs sent to pharmacy.  Assessment: 1. Vaginal bleeding in pregnancy, first trimester   2. Vaginal bleeding affecting early pregnancy   3. [redacted] weeks gestation of pregnancy   4. Fetal heart rate present, first trimester    Plan: Discharge home in stable condition with first trimester bleeding precautions.  Encouraged to begin routine OB care.   Allergies as of 10/16/2021   No Known Allergies      Medication List     STOP taking these medications     cephALEXin 500 MG capsule Commonly known as: KEFLEX   omeprazole 40 MG capsule Commonly known as: PRILOSEC       TAKE these medications    PrePLUS 27-1 MG Tabs Take 1 tablet by mouth daily.   valACYclovir 500 MG tablet Commonly known as: VALTREX Take 1  tablet (500 mg total) by mouth daily.       Gaylan Gerold, CNM, MSN, [place] Certified Nurse Midwife, [facility]

## 2021-10-16 NOTE — Progress Notes (Signed)
WRitten and verbal d/c instructions given and understanding voiced.  

## 2021-10-16 NOTE — Discharge Instructions (Signed)
[facility] Prenatal Care Providers          [facility] locations:  Hours may vary. Please call for an appointment  [facility] at [facility]             [address], [place], [place] [address] [phone]  [facility] at [facility]                                                             [address], [address], [place], [place], [address] [phone]  [facility] at [facility]                                    [address], [address], [place], [place], [address] [phone]  [facility] at [place] [address], [place], [place], [address] [phone]  [facility] at [facility]                                 [address], [place], [place], [address] [phone]  [facility] at [facility]                                    [address], [place], [place], [address] [phone]  [facility] at [facility] [address], [address], [place], [place], [address]                               

## 2021-10-17 LAB — URINALYSIS, ROUTINE W REFLEX MICROSCOPIC
Bilirubin Urine: NEGATIVE
Glucose, UA: NEGATIVE mg/dL
Ketones, ur: NEGATIVE mg/dL
Leukocytes,Ua: NEGATIVE
Nitrite: NEGATIVE
Protein, ur: NEGATIVE mg/dL
Specific Gravity, Urine: 1.02 (ref 1.005–1.030)
pH: 8 (ref 5.0–8.0)

## 2021-10-17 LAB — URINALYSIS, MICROSCOPIC (REFLEX)

## 2021-10-23 ENCOUNTER — Other Ambulatory Visit: Payer: Self-pay | Admitting: Adult Health

## 2021-11-05 NOTE — L&D Delivery Note (Addendum)
Patient progressed from 2.5 cm to complete in 1 hour. She did not get treated GBS. Patient complete and pushing. SVD of viable female infant over intact perineum. Nuchal cord x 1, reduced after delivery of body. Infant delivered to mom's abdomen. Delayed cord clamping x 1 minute. Cord clamped x 2, cut. Spontaneous cry heard. Weight and Apgars pending. Cord blood obtained. Placenta delivered spontaneously and intact. Vagina inspected bilateral periurethral abrasions noted lacerations noted.  EBL: 100 cc Anesthesia: IV

## 2021-11-23 ENCOUNTER — Other Ambulatory Visit: Payer: Self-pay | Admitting: Obstetrics & Gynecology

## 2021-11-23 DIAGNOSIS — Z3682 Encounter for antenatal screening for nuchal translucency: Secondary | ICD-10-CM

## 2021-11-24 ENCOUNTER — Ambulatory Visit (INDEPENDENT_AMBULATORY_CARE_PROVIDER_SITE_OTHER): Payer: Medicaid Other

## 2021-11-24 ENCOUNTER — Encounter: Payer: Self-pay | Admitting: Women's Health

## 2021-11-24 ENCOUNTER — Ambulatory Visit (INDEPENDENT_AMBULATORY_CARE_PROVIDER_SITE_OTHER): Payer: Medicaid Other | Admitting: Women's Health

## 2021-11-24 ENCOUNTER — Other Ambulatory Visit (HOSPITAL_COMMUNITY)
Admission: RE | Admit: 2021-11-24 | Discharge: 2021-11-24 | Disposition: A | Discharge status: Home / Self Care | Payer: Medicaid Other | Source: Ambulatory Visit | Attending: Women's Health | Admitting: Women's Health

## 2021-11-24 ENCOUNTER — Ambulatory Visit: Payer: Medicaid Other | Admitting: *Deleted

## 2021-11-24 ENCOUNTER — Other Ambulatory Visit: Payer: Self-pay

## 2021-11-24 VITALS — BP 121/68 | HR 96 | Wt 156.0 lb

## 2021-11-24 DIAGNOSIS — Z3402 Encounter for supervision of normal first pregnancy, second trimester: Secondary | ICD-10-CM

## 2021-11-24 DIAGNOSIS — B009 Herpesviral infection, unspecified: Secondary | ICD-10-CM

## 2021-11-24 DIAGNOSIS — Z3A13 13 weeks gestation of pregnancy: Secondary | ICD-10-CM

## 2021-11-24 DIAGNOSIS — Z124 Encounter for screening for malignant neoplasm of cervix: Secondary | ICD-10-CM

## 2021-11-24 DIAGNOSIS — Z113 Encounter for screening for infections with a predominantly sexual mode of transmission: Secondary | ICD-10-CM | POA: Insufficient documentation

## 2021-11-24 DIAGNOSIS — Z3401 Encounter for supervision of normal first pregnancy, first trimester: Secondary | ICD-10-CM | POA: Insufficient documentation

## 2021-11-24 DIAGNOSIS — Z3682 Encounter for antenatal screening for nuchal translucency: Secondary | ICD-10-CM | POA: Diagnosis not present

## 2021-11-24 DIAGNOSIS — Z3400 Encounter for supervision of normal first pregnancy, unspecified trimester: Secondary | ICD-10-CM | POA: Insufficient documentation

## 2021-11-24 MED ORDER — BLOOD PRESSURE MONITOR MISC: 0 refills | Status: DC

## 2021-11-24 MED ORDER — ASPIRIN 81 MG PO TBEC: 81.0000 mg | DELAYED_RELEASE_TABLET | Freq: Every day | ORAL | 3 refills | Status: DC

## 2021-11-24 MED ORDER — PREPLUS 27-1 MG PO TABS: 1.0000 | ORAL_TABLET | Freq: Every day | ORAL | 3 refills | Status: AC

## 2021-11-24 NOTE — Patient Instructions (Signed)
Mafalda, thank you for choosing our office today! We appreciate the opportunity to meet your healthcare needs. You may receive a short survey by mail, e-mail, or through MyChart. If you are happy with your care we would appreciate if you could take just a few minutes to complete the survey questions. We read all of your comments and take your feedback very seriously. Thank you again for choosing our office.  [facility] ([address] [place], [place] [address]) Entrance C, located off of [address] Free 24/7 valet parking   Nausea & Vomiting Have saltine crackers or pretzels by your bed and eat a few bites before you raise your head out of bed in the morning Eat small frequent meals throughout the day instead of large meals Drink plenty of fluids throughout the day to stay hydrated, just don't drink a lot of fluids with your meals.  This can make your stomach fill up faster making you feel sick Do not brush your teeth right after you eat Products with real ginger are good for nausea, like ginger ale and ginger hard candy Make sure it says made with real ginger! Sucking on sour candy like lemon heads is also good for nausea If your prenatal vitamins make you nauseated, take them at night so you will sleep through the nausea Sea Bands If you feel like you need medicine for the nausea & vomiting please let us know If you are unable to keep any fluids or food down please let us know   Constipation Drink plenty of fluid, preferably water, throughout the day Eat foods high in fiber such as fruits, vegetables, and grains Exercise, such as walking, is a good way to keep your bowels regular Drink warm fluids, especially warm prune juice, or decaf coffee Eat a 1/2 cup of real oatmeal (not instant), 1/2 cup applesauce, and 1/2-1 cup warm prune juice every day If needed, you may take Colace (docusate sodium) stool softener  once or twice a day to help keep the stool soft.  If you still are having problems with constipation, you may take Miralax once daily as needed to help keep your bowels regular.   Home Blood Pressure Monitoring for Patients   Your provider has recommended that you check your blood pressure (BP) at least once a week at home. If you do not have a blood pressure cuff at home, one will be provided for you. Contact your provider if you have not received your monitor within 1 week.   Helpful Tips for Accurate Home Blood Pressure Checks  Don't smoke, exercise, or drink caffeine 30 minutes before checking your BP Use the restroom before checking your BP (a full bladder can raise your pressure) Relax in a comfortable upright chair Feet on the ground Left arm resting comfortably on a flat surface at the level of your heart Legs uncrossed Back supported Sit quietly and don't talk Place the cuff on your bare arm Adjust snuggly, so that only two fingertips can fit between your skin and the top of the cuff Check 2 readings separated by at least one minute Keep a log of your BP readings For a visual, please reference this diagram: http://ccnc.care/bpdiagram  Provider Name: [facility]     Phone: [phone]  Zone 1: ALL CLEAR  Continue to monitor your symptoms:  BP reading is less than 140 (top number) or less than 90 (bottom   number)  No right upper stomach pain No headaches or seeing spots No feeling nauseated or throwing up No swelling in face and hands  Zone 2: CAUTION Call your doctor's office for any of the following:  BP reading is greater than 140 (top number) or greater than 90 (bottom number)  Stomach pain under your ribs in the middle or right side Headaches or seeing spots Feeling nauseated or throwing up Swelling in face and hands  Zone 3: EMERGENCY  Seek immediate medical care if you have any of the following:  BP reading is greater than160 (top number) or greater than  110 (bottom number) Severe headaches not improving with Tylenol Serious difficulty catching your breath Any worsening symptoms from Zone 2    First Trimester of Pregnancy The first trimester of pregnancy is from week 1 until the end of week 12 (months 1 through 3). A week after a sperm fertilizes an egg, the egg will implant on the wall of the uterus. This embryo will begin to develop into a baby. Genes from you and your partner are forming the baby. The female genes determine whether the baby is a boy or a girl. At 6-8 weeks, the eyes and face are formed, and the heartbeat can be seen on ultrasound. At the end of 12 weeks, all the baby's organs are formed.  Now that you are pregnant, you will want to do everything you can to have a healthy baby. Two of the most important things are to get good prenatal care and to follow your health care provider's instructions. Prenatal care is all the medical care you receive before the baby's birth. This care will help prevent, find, and treat any problems during the pregnancy and childbirth. BODY CHANGES Your body goes through many changes during pregnancy. The changes vary from woman to woman.  You may gain or lose a couple of pounds at first. You may feel sick to your stomach (nauseous) and throw up (vomit). If the vomiting is uncontrollable, call your health care provider. You may tire easily. You may develop headaches that can be relieved by medicines approved by your health care provider. You may urinate more often. Painful urination may mean you have a bladder infection. You may develop heartburn as a result of your pregnancy. You may develop constipation because certain hormones are causing the muscles that push waste through your intestines to slow down. You may develop hemorrhoids or swollen, bulging veins (varicose veins). Your breasts may begin to grow larger and become tender. Your nipples may stick out more, and the tissue that surrounds them  (areola) may become darker. Your gums may bleed and may be sensitive to brushing and flossing. Dark spots or blotches (chloasma, mask of pregnancy) may develop on your face. This will likely fade after the baby is born. Your menstrual periods will stop. You may have a loss of appetite. You may develop cravings for certain kinds of food. You may have changes in your emotions from day to day, such as being excited to be pregnant or being concerned that something may go wrong with the pregnancy and baby. You may have more vivid and strange dreams. You may have changes in your hair. These can include thickening of your hair, rapid growth, and changes in texture. Some women also have hair loss during or after pregnancy, or hair that feels dry or thin. Your hair will most likely return to normal after your baby is born. WHAT TO EXPECT AT YOUR PRENATAL   VISITS During a routine prenatal visit: You will be weighed to make sure you and the baby are growing normally. Your blood pressure will be taken. Your abdomen will be measured to track your baby's growth. The fetal heartbeat will be listened to starting around week 10 or 12 of your pregnancy. Test results from any previous visits will be discussed. Your health care provider may ask you: How you are feeling. If you are feeling the baby move. If you have had any abnormal symptoms, such as leaking fluid, bleeding, severe headaches, or abdominal cramping. If you have any questions. Other tests that may be performed during your first trimester include: Blood tests to find your blood type and to check for the presence of any previous infections. They will also be used to check for low iron levels (anemia) and Rh antibodies. Later in the pregnancy, blood tests for diabetes will be done along with other tests if problems develop. Urine tests to check for infections, diabetes, or protein in the urine. An ultrasound to confirm the proper growth and development  of the baby. An amniocentesis to check for possible genetic problems. Fetal screens for spina bifida and Down syndrome. You may need other tests to make sure you and the baby are doing well. HOME CARE INSTRUCTIONS  Medicines Follow your health care provider's instructions regarding medicine use. Specific medicines may be either safe or unsafe to take during pregnancy. Take your prenatal vitamins as directed. If you develop constipation, try taking a stool softener if your health care provider approves. Diet Eat regular, well-balanced meals. Choose a variety of foods, such as meat or vegetable-based protein, fish, milk and low-fat dairy products, vegetables, fruits, and whole grain breads and cereals. Your health care provider will help you determine the amount of weight gain that is right for you. Avoid raw meat and uncooked cheese. These carry germs that can cause birth defects in the baby. Eating four or five small meals rather than three large meals a day may help relieve nausea and vomiting. If you start to feel nauseous, eating a few soda crackers can be helpful. Drinking liquids between meals instead of during meals also seems to help nausea and vomiting. If you develop constipation, eat more high-fiber foods, such as fresh vegetables or fruit and whole grains. Drink enough fluids to keep your urine clear or pale yellow. Activity and Exercise Exercise only as directed by your health care provider. Exercising will help you: Control your weight. Stay in shape. Be prepared for labor and delivery. Experiencing pain or cramping in the lower abdomen or low back is a good sign that you should stop exercising. Check with your health care provider before continuing normal exercises. Try to avoid standing for long periods of time. Move your legs often if you must stand in one place for a long time. Avoid heavy lifting. Wear low-heeled shoes, and practice good posture. You may continue to have sex  unless your health care provider directs you otherwise. Relief of Pain or Discomfort Wear a good support bra for breast tenderness.   Take warm sitz baths to soothe any pain or discomfort caused by hemorrhoids. Use hemorrhoid cream if your health care provider approves.   Rest with your legs elevated if you have leg cramps or low back pain. If you develop varicose veins in your legs, wear support hose. Elevate your feet for 15 minutes, 3-4 times a day. Limit salt in your diet. Prenatal Care Schedule your prenatal visits by the   twelfth week of pregnancy. They are usually scheduled monthly at first, then more often in the last 2 months before delivery. Write down your questions. Take them to your prenatal visits. Keep all your prenatal visits as directed by your health care provider. Safety Wear your seat belt at all times when driving. Make a list of emergency phone numbers, including numbers for family, friends, the hospital, and police and fire departments. General Tips Ask your health care provider for a referral to a local prenatal education class. Begin classes no later than at the beginning of month 6 of your pregnancy. Ask for help if you have counseling or nutritional needs during pregnancy. Your health care provider can offer advice or refer you to specialists for help with various needs. Do not use hot tubs, steam rooms, or saunas. Do not douche or use tampons or scented sanitary pads. Do not cross your legs for long periods of time. Avoid cat litter boxes and soil used by cats. These carry germs that can cause birth defects in the baby and possibly loss of the fetus by miscarriage or stillbirth. Avoid all smoking, herbs, alcohol, and medicines not prescribed by your health care provider. Chemicals in these affect the formation and growth of the baby. Schedule a dentist appointment. At home, brush your teeth with a soft toothbrush and be gentle when you floss. SEEK MEDICAL CARE IF:   You have dizziness. You have mild pelvic cramps, pelvic pressure, or nagging pain in the abdominal area. You have persistent nausea, vomiting, or diarrhea. You have a bad smelling vaginal discharge. You have pain with urination. You notice increased swelling in your face, hands, legs, or ankles. SEEK IMMEDIATE MEDICAL CARE IF:  You have a fever. You are leaking fluid from your vagina. You have spotting or bleeding from your vagina. You have severe abdominal cramping or pain. You have rapid weight gain or loss. You vomit blood or material that looks like coffee grounds. You are exposed to German measles and have never had them. You are exposed to fifth disease or chickenpox. You develop a severe headache. You have shortness of breath. You have any kind of trauma, such as from a fall or a car accident. Document Released: 10/16/2001 Document Revised: 03/08/2014 Document Reviewed: 09/01/2013 ExitCare Patient Information 2015 [employer]. This information is not intended to replace advice given to you by your health care provider. Make sure you discuss any questions you have with your health care provider.  

## 2021-11-24 NOTE — Progress Notes (Signed)
INITIAL OBSTETRICAL VISIT Patient name: Jackie Brennan MRN [id number]  Date of birth: 12-03-2000 Chief Complaint:   Initial Prenatal Visit  History of Present Illness:   Jackie Brennan is a 21 y.o. G43P0000 African-American female at [redacted]w[redacted]d by [place] at 7 weeks with an Estimated Date of Delivery: 05/29/22 being seen today for her initial obstetrical visit.   Patient's last menstrual period was 07/25/2021. Her obstetrical history is significant for primigravida.   Today she reports no complaints.  Last pap never. Results were: N/A  Depression screen [facility] 2/9 11/24/2021 11/12/2019  Decreased Interest 0 0  Down, Depressed, Hopeless 0 0  PHQ - 2 Score 0 0  Altered sleeping 1 -  Tired, decreased energy 1 -  Change in appetite 0 -  Feeling bad or failure about yourself  0 -  Trouble concentrating 0 -  Moving slowly or fidgety/restless 0 -  Suicidal thoughts 0 -  PHQ-9 Score 2 -     GAD 7 : Generalized Anxiety Score 11/24/2021  Nervous, Anxious, on Edge 1  Control/stop worrying 1  Worry too much - different things 1  Trouble relaxing 0  Restless 0  Easily annoyed or irritable 0  Afraid - awful might happen 0  Total GAD 7 Score 3     Review of Systems:   Pertinent items are noted in HPI Denies cramping/contractions, leakage of fluid, vaginal bleeding, abnormal vaginal discharge w/ itching/odor/irritation, headaches, visual changes, shortness of breath, chest pain, abdominal pain, severe nausea/vomiting, or problems with urination or bowel movements unless otherwise stated above.  Pertinent History Reviewed:  Reviewed past medical,surgical, social, obstetrical and family history.  Reviewed problem list, medications and allergies. OB History  Gravida Para Term Preterm AB Living  1 0 0 0 0 0  SAB IAB Ectopic Multiple Live Births  0 0 0 0 0    # Outcome Date GA Lbr Len/2nd Weight Sex Delivery Anes PTL Lv  1 Current            Physical Assessment:   Vitals:   11/24/21 1108   BP: 121/68  Pulse: 96  Weight: 156 lb (70.8 kg)  Body mass index is 28.53 kg/m.       Physical Examination:  General appearance - well appearing, and in no distress  Mental status - alert, oriented to person, place, and time  Psych:  She has a normal mood and affect  Skin - warm and dry, normal color, no suspicious lesions noted  Chest - effort normal, all lung fields clear to auscultation bilaterally  Heart - normal rate and regular rhythm  Abdomen - soft, nontender  Extremities:  No swelling or varicosities noted  Pelvic - VULVA: normal appearing vulva with no masses, tenderness or lesions  VAGINA: normal appearing vagina with normal color and discharge, no lesions  CERVIX: normal appearing cervix without discharge or lesions, no CMT  Thin prep pap is done w/ HR HPV cotesting  Chaperone: Malachy Mood    TODAY'S NT [place] 13+3 wks,measurements c/w dates,CRL 76.42 mm,posterior placenta,normal ovaries,NT 2.7 mm,NB present,FHR 149 bpm  No results found for this or any previous visit (from the past 24 hour(s)).  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 Swallow whole.    Dispense:  90 tablet    Refill:  3    Order Specific Question:   Supervising Provider    Answer:   Duane Lope H [[id number]]   Prenatal Vit-Fe Fumarate-FA (PREPLUS) 27-1 MG TABS    Sig: Take 1 tablet by mouth daily.    Dispense:  90 tablet    Refill:  3    Order Specific Question:   Supervising Provider    Answer:   Duane Lope H [[id number]]    Initial labs obtained Continue prenatal vitamins Reviewed  n/v relief measures and warning s/s to report Reviewed recommended weight gain based on pre-gravid BMI Encouraged well-balanced diet Genetic & carrier screening discussed: requests Panorama, NT/IT, and Horizon  Ultrasound discussed; fetal survey: requested CCNC completed> form faxed if has or is planning to apply for medicaid The nature of [facility] [employer] with multiple MDs and other Advanced Practice Providers was explained to patient; also emphasized that fellows, residents, and students are part of our team. Does not have home bp cuff. Office bp cuff given: no. Rx sent: yes. Check bp weekly, let us know if consistently >140/90.   Indications for ASA therapy (per uptodate) OR Two or more of the following: Nulliparity Yes Sociodemographic characteristics (African American race, low socioeconomic level) Yes  Follow-up: Return in about 3 weeks (around 12/15/2021) for LROB, 2nd IT, CNM, in person.   Orders Placed This Encounter  Procedures   Urine Culture   Integrated 1   Genetic Screening   CBC/D/Plt+RPR+Rh+ABO+RubIgG...   POC Urinalysis Dipstick OB    Cheral Marker CNM, [facility] 11/24/2021 12:18 PM

## 2021-11-24 NOTE — Progress Notes (Signed)
[place] 13+3 wks,measurements c/w dates,CRL 76.42 mm,posterior placenta,normal ovaries,NT 2.7 mm,NB present,FHR 149 bpm

## 2021-11-26 LAB — URINE CULTURE

## 2021-11-27 LAB — CYTOLOGY - PAP
Chlamydia: NEGATIVE
Comment: NEGATIVE
Comment: NORMAL
Diagnosis: NEGATIVE
Neisseria Gonorrhea: NEGATIVE

## 2021-11-28 LAB — CBC/D/PLT+RPR+RH+ABO+RUBIGG...
Antibody Screen: NEGATIVE
Basophils Absolute: 0 10*3/uL (ref 0.0–0.2)
Basos: 0 %
EOS (ABSOLUTE): 0.1 10*3/uL (ref 0.0–0.4)
Eos: 2 %
HCV Ab: <0.1 s/co ratio (ref 0.0–0.9)
HIV Screen 4th Generation wRfx: NONREACTIVE
Hematocrit: 32.5 % — ABNORMAL LOW (ref 34.0–46.6)
Hemoglobin: 11 g/dL — ABNORMAL LOW (ref 11.1–15.9)
Hepatitis B Surface Ag: NEGATIVE
Immature Grans (Abs): 0 10*3/uL (ref 0.0–0.1)
Immature Granulocytes: 0 %
Lymphocytes Absolute: 1.6 10*3/uL (ref 0.7–3.1)
Lymphs: 25 %
MCH: 28.4 pg (ref 26.6–33.0)
MCHC: 33.8 g/dL (ref 31.5–35.7)
MCV: 84 fL (ref 79–97)
Monocytes Absolute: 0.7 10*3/uL (ref 0.1–0.9)
Monocytes: 10 %
Neutrophils Absolute: 4 10*3/uL (ref 1.4–7.0)
Neutrophils: 63 %
Platelets: 241 10*3/uL (ref 150–450)
RBC: 3.88 x10E6/uL (ref 3.77–5.28)
RDW: 14.5 % (ref 11.7–15.4)
RPR Ser Ql: NONREACTIVE
Rh Factor: POSITIVE
Rubella Antibodies, IGG: 4.21 index (ref >0.99)
WBC: 6.4 10*3/uL (ref 3.4–10.8)

## 2021-11-28 LAB — INTEGRATED 1
Crown Rump Length: 76.4 mm
Gest. Age on Collection Date: 13.4 weeks
Maternal Age at EDD: 22.4 yr
Nuchal Translucency (NT): 2.7 mm
Number of Fetuses: 1
PAPP-A Value: 1113.3 ng/mL
Weight: 156 [lb_av]

## 2021-11-28 LAB — HCV INTERPRETATION

## 2021-12-12 ENCOUNTER — Encounter: Payer: Self-pay | Admitting: Women's Health

## 2021-12-15 ENCOUNTER — Other Ambulatory Visit: Payer: Self-pay

## 2021-12-15 ENCOUNTER — Ambulatory Visit (INDEPENDENT_AMBULATORY_CARE_PROVIDER_SITE_OTHER): Payer: Medicaid Other | Admitting: Obstetrics & Gynecology

## 2021-12-15 ENCOUNTER — Encounter: Payer: Self-pay | Admitting: Obstetrics & Gynecology

## 2021-12-15 VITALS — BP 116/70 | HR 97 | Wt 156.6 lb

## 2021-12-15 DIAGNOSIS — Z1379 Encounter for other screening for genetic and chromosomal anomalies: Secondary | ICD-10-CM

## 2021-12-15 DIAGNOSIS — Z3402 Encounter for supervision of normal first pregnancy, second trimester: Secondary | ICD-10-CM

## 2021-12-15 NOTE — Progress Notes (Signed)
° °  LOW-RISK PREGNANCY VISIT Patient name: Jackie Brennan MRN [id number]  Date of birth: 02-23-2001 Chief Complaint:   Routine Prenatal Visit  History of Present Illness:   Jackie Brennan is a 21 y.o. G75P0000 female at [redacted]w[redacted]d with an Estimated Date of Delivery: 05/29/22 being seen today for ongoing management of a low-risk pregnancy.   Depression screen [facility] 2/9 11/24/2021 11/12/2019  Decreased Interest 0 0  Down, Depressed, Hopeless 0 0  PHQ - 2 Score 0 0  Altered sleeping 1 -  Tired, decreased energy 1 -  Change in appetite 0 -  Feeling bad or failure about yourself  0 -  Trouble concentrating 0 -  Moving slowly or fidgety/restless 0 -  Suicidal thoughts 0 -  PHQ-9 Score 2 -    Today she reports no complaints. Contractions: Irritability. Vag. Bleeding: None.  Movement: Absent. denies leaking of fluid. Review of Systems:   Pertinent items are noted in HPI Denies abnormal vaginal discharge w/ itching/odor/irritation, headaches, visual changes, shortness of breath, chest pain, abdominal pain, severe nausea/vomiting, or problems with urination or bowel movements unless otherwise stated above. Pertinent History Reviewed:  Reviewed past medical,surgical, social, obstetrical and family history.  Reviewed problem list, medications and allergies.  Physical Assessment:   Vitals:   12/15/21 1012  BP: 116/70  Pulse: 97  Weight: 156 lb 9.6 oz (71 kg)  Body mass index is 28.64 kg/m.        Physical Examination:   General appearance: Well appearing, and in no distress  Mental status: Alert, oriented to person, place, and time  Skin: Warm & dry  Respiratory: Normal respiratory effort, no distress  Abdomen: Soft, gravid, nontender  Pelvic: Cervical exam deferred         Extremities: Edema: None  Psych:  mood and affect appropriate  Fetal Status: Fetal Heart Rate (bpm): 150   Movement: Absent    Chaperone: n/a    No results found for this or any previous visit (from the past 24  hour(s)).   Assessment & Plan:  1) Low-risk pregnancy G1P0000 at [redacted]w[redacted]d with an Estimated Date of Delivery: 05/29/22   -anatomy scan next visit   Meds: No orders of the defined types were placed in this encounter.  Labs/procedures today: IT-2  Plan:  Continue routine obstetrical care  Next visit: prefers in person    Reviewed: Preterm labor symptoms and general obstetric precautions including but not limited to vaginal bleeding, contractions, leaking of fluid and fetal movement were reviewed in detail with the patient.  All questions were answered.   Follow-up: Return in about 4 weeks (around 01/12/2022) for LROB visit and anatomy scan.  Orders Placed This Encounter  Procedures   INTEGRATED 2    Myna Hidalgo, DO Attending Obstetrician & Gynecologist, [facility] for [employer], [facility]

## 2021-12-19 ENCOUNTER — Encounter: Payer: Self-pay | Admitting: Women's Health

## 2021-12-19 LAB — INTEGRATED 2
AFP MoM: 0.66
Alpha-Fetoprotein: 24.6 ng/mL
Crown Rump Length: 76.4 mm
DIA MoM: 1.12
DIA Value: 171.5 pg/mL
Estriol, Unconjugated: 1.27 ng/mL
Gest. Age on Collection Date: 13.4 weeks
Gestational Age: 16.4 weeks
Maternal Age at EDD: 22.4 yr
Nuchal Translucency (NT): 2.7 mm
Nuchal Translucency MoM: 1.52
Number of Fetuses: 1
PAPP-A MoM: 0.88
PAPP-A Value: 1113.3 ng/mL
Test Results:: NEGATIVE
Weight: 156 [lb_av]
Weight: 156 [lb_av]
hCG MoM: 2.26
hCG Value: 78 IU/mL
uE3 MoM: 1.23

## 2022-01-11 ENCOUNTER — Other Ambulatory Visit: Payer: Self-pay | Admitting: Obstetrics & Gynecology

## 2022-01-11 DIAGNOSIS — Z363 Encounter for antenatal screening for malformations: Secondary | ICD-10-CM

## 2022-01-12 ENCOUNTER — Other Ambulatory Visit: Payer: Self-pay

## 2022-01-12 ENCOUNTER — Ambulatory Visit (INDEPENDENT_AMBULATORY_CARE_PROVIDER_SITE_OTHER): Payer: Medicaid Other

## 2022-01-12 ENCOUNTER — Ambulatory Visit (INDEPENDENT_AMBULATORY_CARE_PROVIDER_SITE_OTHER): Payer: Medicaid Other | Admitting: Advanced Practice Midwife

## 2022-01-12 VITALS — BP 114/67 | HR 84 | Wt 160.6 lb

## 2022-01-12 DIAGNOSIS — Z3402 Encounter for supervision of normal first pregnancy, second trimester: Secondary | ICD-10-CM

## 2022-01-12 DIAGNOSIS — Z3A20 20 weeks gestation of pregnancy: Secondary | ICD-10-CM

## 2022-01-12 DIAGNOSIS — Z363 Encounter for antenatal screening for malformations: Secondary | ICD-10-CM | POA: Diagnosis not present

## 2022-01-12 NOTE — Progress Notes (Signed)
[place] 20+3 wks,breech,cx 3.3 cm,posterior placenta gr 0,normal ovaries,fhr 142 bpm,SVP 5 cm,EFW 404 g 82%,anatomy complete,no obvious abnormalities  ?

## 2022-01-12 NOTE — Patient Instructions (Signed)
Jackie Brennan, thank you for choosing our office today! We appreciate the opportunity to meet your healthcare needs. You may receive a short survey by mail, e-mail, or through MyChart. If you are happy with your care we would appreciate if you could take just a few minutes to complete the survey questions. We read all of your comments and take your feedback very seriously. Thank you again for choosing our office.  [facility] Team at [facility] ([address] [place], [place] [address]) Entrance C, located off of [address] Free 24/7 valet parking  Go to [email or web address] to register for FREE online childbirth classes  Call the office ([phone]) or go to [facility] if: You begin to severe cramping Your water breaks.  Sometimes it is a big gush of fluid, sometimes it is just a trickle that keeps getting your panties wet or running down your legs You have vaginal bleeding.  It is normal to have a small amount of spotting if your cervix was checked.   [place] [facility]/Family Doctors [facility] ([facility]): [address], [phone]           [facility]: [address], [phone]                [facility] ([facility]): [address], [phone] (call to ask if accepting patients) [facility]: [address], [place], [phone]    [place] [facility]/Family Doctors [facility] ([facility]): [address], [phone] [facility]: [address], [phone] [facility]: [address], [phone]  [place] [facility]  [facility] ([facility]): [phone] [facility]: [address], [phone]   [place] [facility]: [address], [phone]  [place] [facility]  [facility]: [address], [phone]  Home Blood Pressure Monitoring for Patients   Your provider has recommended that you check your blood pressure (BP) at least once a week at home. If you do not have a blood pressure cuff at home, one will be provided for you. Contact your provider if you have not received your monitor within 1 week.   Helpful Tips for Accurate Home Blood Pressure Checks  Don't smoke, exercise, or drink caffeine 30 minutes before checking your BP Use the restroom before checking your BP (a full bladder can raise your pressure) Relax in a comfortable upright chair Feet on the ground Left arm resting comfortably on a flat surface at the level of your heart Legs uncrossed Back supported Sit quietly and don't talk Place the cuff on your bare arm Adjust snuggly, so that only two fingertips can fit between your skin and the top of the cuff Check 2 readings separated by at least one minute Keep a log of your BP readings For a visual, please reference this diagram: http://[facility].care/bpdiagram  Provider Name: [facility]     Phone: [phone]  Zone 1: ALL CLEAR  Continue to monitor your symptoms:  BP reading is less than 140 (top number) or less than 90 (bottom number)  No right upper stomach pain No headaches or seeing spots No feeling nauseated or throwing up No swelling in face and hands  Zone 2: CAUTION Call your doctor's office for any of the following:  BP reading is greater than 140 (top number) or greater than   90 (bottom number)  Stomach pain under your ribs in the middle or right side Headaches or seeing spots Feeling nauseated or throwing up Swelling in face and hands  Zone 3: EMERGENCY  Seek immediate medical care if you have any of the following:  BP reading is greater than160 (top number) or greater than 110 (bottom number) Severe headaches not improving with Tylenol Serious difficulty catching your breath Any worsening symptoms from  Zone 2     Second Trimester of Pregnancy The second trimester is from week 14 through week 27 (months 4 through 6). The second trimester is often a time when you feel your best. Your body has adjusted to being pregnant, and you begin to feel better physically. Usually, morning sickness has lessened or quit completely, you may have more energy, and you may have an increase in appetite. The second trimester is also a time when the fetus is growing rapidly. At the end of the sixth month, the fetus is about 9 inches long and weighs about 1 pounds. You will likely begin to feel the baby move (quickening) between 16 and 20 weeks of pregnancy. Body changes during your second trimester Your body continues to go through many changes during your second trimester. The changes vary from woman to woman. Your weight will continue to increase. You will notice your lower abdomen bulging out. You may begin to get stretch marks on your hips, abdomen, and breasts. You may develop headaches that can be relieved by medicines. The medicines should be approved by your health care provider. You may urinate more often because the fetus is pressing on your bladder. You may develop or continue to have heartburn as a result of your pregnancy. You may develop constipation because certain hormones are causing the muscles that push waste through your intestines to slow down. You may develop hemorrhoids or swollen, bulging veins (varicose veins). You may have back pain. This is caused by: Weight gain. Pregnancy hormones that are relaxing the joints in your pelvis. A shift in weight and the muscles that support your balance. Your breasts will continue to grow and they will continue to become tender. Your gums may bleed and may be sensitive to brushing and flossing. Dark spots or blotches (chloasma, mask of pregnancy) may develop on your face. This will likely fade after the baby is born. A dark line from your belly button to  the pubic area (linea nigra) may appear. This will likely fade after the baby is born. You may have changes in your hair. These can include thickening of your hair, rapid growth, and changes in texture. Some women also have hair loss during or after pregnancy, or hair that feels dry or thin. Your hair will most likely return to normal after your baby is born.  What to expect at prenatal visits During a routine prenatal visit: You will be weighed to make sure you and the fetus are growing normally. Your blood pressure will be taken. Your abdomen will be measured to track your baby's growth. The fetal heartbeat will be listened to. Any test results from the previous visit will be discussed.  Your health care provider may ask you: How you are feeling. If you are feeling the baby move. If you have had any abnormal symptoms, such as leaking fluid, bleeding, severe headaches, or abdominal cramping. If you are using any tobacco products, including cigarettes, chewing tobacco, and electronic cigarettes. If you have any questions.  Other tests that may be performed during   your second trimester include: Blood tests that check for: Low iron levels (anemia). High blood sugar that affects pregnant women (gestational diabetes) between 24 and 28 weeks. Rh antibodies. This is to check for a protein on red blood cells (Rh factor). Urine tests to check for infections, diabetes, or protein in the urine. An ultrasound to confirm the proper growth and development of the baby. An amniocentesis to check for possible genetic problems. Fetal screens for spina bifida and Down syndrome. HIV (human immunodeficiency virus) testing. Routine prenatal testing includes screening for HIV, unless you choose not to have this test.  Follow these instructions at home: Medicines Follow your health care provider's instructions regarding medicine use. Specific medicines may be either safe or unsafe to take during  pregnancy. Take a prenatal vitamin that contains at least 600 micrograms (mcg) of folic acid. If you develop constipation, try taking a stool softener if your health care provider approves. Eating and drinking Eat a balanced diet that includes fresh fruits and vegetables, whole grains, good sources of protein such as meat, eggs, or tofu, and low-fat dairy. Your health care provider will help you determine the amount of weight gain that is right for you. Avoid raw meat and uncooked cheese. These carry germs that can cause birth defects in the baby. If you have low calcium intake from food, talk to your health care provider about whether you should take a daily calcium supplement. Limit foods that are high in fat and processed sugars, such as fried and sweet foods. To prevent constipation: Drink enough fluid to keep your urine clear or pale yellow. Eat foods that are high in fiber, such as fresh fruits and vegetables, whole grains, and beans. Activity Exercise only as directed by your health care provider. Most women can continue their usual exercise routine during pregnancy. Try to exercise for 30 minutes at least 5 days a week. Stop exercising if you experience uterine contractions. Avoid heavy lifting, wear low heel shoes, and practice good posture. A sexual relationship may be continued unless your health care provider directs you otherwise. Relieving pain and discomfort Wear a good support bra to prevent discomfort from breast tenderness. Take warm sitz baths to soothe any pain or discomfort caused by hemorrhoids. Use hemorrhoid cream if your health care provider approves. Rest with your legs elevated if you have leg cramps or low back pain. If you develop varicose veins, wear support hose. Elevate your feet for 15 minutes, 3-4 times a day. Limit salt in your diet. Prenatal Care Write down your questions. Take them to your prenatal visits. Keep all your prenatal visits as told by your health  care provider. This is important. Safety Wear your seat belt at all times when driving. Make a list of emergency phone numbers, including numbers for family, friends, the hospital, and police and fire departments. General instructions Ask your health care provider for a referral to a local prenatal education class. Begin classes no later than the beginning of month 6 of your pregnancy. Ask for help if you have counseling or nutritional needs during pregnancy. Your health care provider can offer advice or refer you to specialists for help with various needs. Do not use hot tubs, steam rooms, or saunas. Do not douche or use tampons or scented sanitary pads. Do not cross your legs for long periods of time. Avoid cat litter boxes and soil used by cats. These carry germs that can cause birth defects in the baby and possibly loss of the   fetus by miscarriage or stillbirth. Avoid all smoking, herbs, alcohol, and unprescribed drugs. Chemicals in these products can affect the formation and growth of the baby. Do not use any products that contain nicotine or tobacco, such as cigarettes and e-cigarettes. If you need help quitting, ask your health care provider. Visit your dentist if you have not gone yet during your pregnancy. Use a soft toothbrush to brush your teeth and be gentle when you floss. Contact a health care provider if: You have dizziness. You have mild pelvic cramps, pelvic pressure, or nagging pain in the abdominal area. You have persistent nausea, vomiting, or diarrhea. You have a bad smelling vaginal discharge. You have pain when you urinate. Get help right away if: You have a fever. You are leaking fluid from your vagina. You have spotting or bleeding from your vagina. You have severe abdominal cramping or pain. You have rapid weight gain or weight loss. You have shortness of breath with chest pain. You notice sudden or extreme swelling of your face, hands, ankles, feet, or legs. You  have not felt your baby move in over an hour. You have severe headaches that do not go away when you take medicine. You have vision changes. Summary The second trimester is from week 14 through week 27 (months 4 through 6). It is also a time when the fetus is growing rapidly. Your body goes through many changes during pregnancy. The changes vary from woman to woman. Avoid all smoking, herbs, alcohol, and unprescribed drugs. These chemicals affect the formation and growth your baby. Do not use any tobacco products, such as cigarettes, chewing tobacco, and e-cigarettes. If you need help quitting, ask your health care provider. Contact your health care provider if you have any questions. Keep all prenatal visits as told by your health care provider. This is important. This information is not intended to replace advice given to you by your health care provider. Make sure you discuss any questions you have with your health care provider. Document Released: 10/16/2001 Document Revised: 03/29/2016 Document Reviewed: 12/23/2012 Elsevier Interactive Patient Education  2017 [employer]  

## 2022-01-12 NOTE — Progress Notes (Signed)
? ?  LOW-RISK PREGNANCY VISIT ?Patient name: Jackie Brennan MRN [id number]  Date of birth: Feb 10, 2001 ?Chief Complaint:   ?Routine Prenatal Visit ? ?History of Present Illness:   ?Jackie Brennan is a 21 y.o. G1P0000 female at [redacted]w[redacted]d with an Estimated Date of Delivery: 05/29/22 being seen today for ongoing management of a low-risk pregnancy.  ?Today she reports  doing well . Contractions: Irritability. Vag. Bleeding: None.  Movement: Present. denies leaking of fluid. ?Review of Systems:   ?Pertinent items are noted in HPI ?Denies abnormal vaginal discharge w/ itching/odor/irritation, headaches, visual changes, shortness of breath, chest pain, abdominal pain, severe nausea/vomiting, or problems with urination or bowel movements unless otherwise stated above. ?Pertinent History Reviewed:  ?Reviewed past medical,surgical, social, obstetrical and family history.  ?Reviewed problem list, medications and allergies. ?Physical Assessment:  ? ?Vitals:  ? 01/12/22 1056  ?BP: 114/67  ?Pulse: 84  ?Weight: 160 lb 9.6 oz (72.8 kg)  ?Body mass index is 29.37 kg/m?. ?  ?     Physical Examination:  ? General appearance: Well appearing, and in no distress ? Mental status: Alert, oriented to person, place, and time ? Skin: Warm & dry ? Cardiovascular: Normal heart rate noted ? Respiratory: Normal respiratory effort, no distress ? Abdomen: Soft, gravid, nontender ? Pelvic: Cervical exam deferred        ? Extremities: Edema: None ? ?Fetal Status: Fetal Heart Rate (bpm): 142 u/s   Movement: Present   ? ?Anatomy u/s: [place] 20+3 wks,breech,cx 3.3 cm,posterior placenta gr 0,normal ovaries,fhr 142 bpm,SVP 5 cm,EFW 404 g 82%,anatomy complete,no obvious abnormalities ? ?No results found for this or any previous visit (from the past 24 hour(s)).  ?Assessment & Plan:  ?1) Low-risk pregnancy G1P0000 at [redacted]w[redacted]d with an Estimated Date of Delivery: 05/29/22  ? ?  ?Meds: No orders of the defined types were placed in this encounter. ? ?Labs/procedures today:  anatomy u/s ? ?Plan:  Continue routine obstetrical care  ? ?Reviewed: Preterm labor symptoms and general obstetric precautions including but not limited to vaginal bleeding, contractions, leaking of fluid and fetal movement were reviewed in detail with the patient.  All questions were answered. Given home bp cuff.  Check bp weekly, let us know if >140/90.  ? ?Follow-up: Return in about 4 weeks (around 02/09/2022) for LROB, in person. ? ?No orders of the defined types were placed in this encounter. ? ?Arabella Merles CNM ?01/12/2022 ?11:17 AM  ?

## 2022-02-06 ENCOUNTER — Telehealth: Payer: Self-pay | Admitting: *Deleted

## 2022-02-06 ENCOUNTER — Encounter: Payer: Self-pay | Admitting: *Deleted

## 2022-02-06 NOTE — Telephone Encounter (Signed)
I spoke with pt. Pt states that she repeated the BP after she got that number, and it was much better. She is not having any symptoms. Pt was driving and couldn't send me the BP reading then but will send it later on. JSY ?

## 2022-02-06 NOTE — Telephone Encounter (Signed)
Error. JSY 

## 2022-02-06 NOTE — Telephone Encounter (Signed)
[facility] called with elevated BP of 140/113. She's not having any symptoms. Left message @ 3:13 pm. Also sent a MyChart message. JSY ?

## 2022-02-08 ENCOUNTER — Encounter: Payer: Self-pay | Admitting: Advanced Practice Midwife

## 2022-02-08 ENCOUNTER — Ambulatory Visit (INDEPENDENT_AMBULATORY_CARE_PROVIDER_SITE_OTHER): Payer: Medicaid Other | Admitting: Advanced Practice Midwife

## 2022-02-08 VITALS — BP 113/69 | HR 81 | Wt 165.5 lb

## 2022-02-08 DIAGNOSIS — Z3A24 24 weeks gestation of pregnancy: Secondary | ICD-10-CM

## 2022-02-08 DIAGNOSIS — Z3402 Encounter for supervision of normal first pregnancy, second trimester: Secondary | ICD-10-CM

## 2022-02-08 NOTE — Progress Notes (Signed)
? ?  LOW-RISK PREGNANCY VISIT ?Patient name: Jackie Brennan MRN [id number]  Date of birth: December 04, 2000 ?Chief Complaint:   ?Routine Prenatal Visit ? ?History of Present Illness:   ?Jackie Brennan is a 21 y.o. G1P0000 female at [redacted]w[redacted]d with an Estimated Date of Delivery: 05/29/22 being seen today for ongoing management of a low-risk pregnancy.  ?Today she reports  occ constipation . Contractions: Not present. Vag. Bleeding: None.  Movement: Present. denies leaking of fluid. ?Review of Systems:   ?Pertinent items are noted in HPI ?Denies abnormal vaginal discharge w/ itching/odor/irritation, headaches, visual changes, shortness of breath, chest pain, abdominal pain, severe nausea/vomiting, or problems with urination or bowel movements unless otherwise stated above. ?Pertinent History Reviewed:  ?Reviewed past medical,surgical, social, obstetrical and family history.  ?Reviewed problem list, medications and allergies. ?Physical Assessment:  ? ?Vitals:  ? 02/08/22 1032  ?BP: 113/69  ?Pulse: 81  ?Weight: 165 lb 8 oz (75.1 kg)  ?Body mass index is 30.27 kg/m?. ?  ?     Physical Examination:  ? General appearance: Well appearing, and in no distress ? Mental status: Alert, oriented to person, place, and time ? Skin: Warm & dry ? Cardiovascular: Normal heart rate noted ? Respiratory: Normal respiratory effort, no distress ? Abdomen: Soft, gravid, nontender ? Pelvic: Cervical exam deferred        ? Extremities: Edema: None ? ?Fetal Status: Fetal Heart Rate (bpm): 140 Fundal Height: 23 cm Movement: Present   ? ?Chaperone: n/a   ? ?No results found for this or any previous visit (from the past 24 hour(s)).  ?Assessment & Plan:  ?1) Low-risk pregnancy G1P0000 at [redacted]w[redacted]d with an Estimated Date of Delivery: 05/29/22  ? ?  ?Meds: No orders of the defined types were placed in this encounter. ? ?Labs/procedures today: none ? ?Plan:  Continue routine obstetrical care  ?Next visit: prefers will be in person for PN2    ? ?Reviewed: Preterm  labor symptoms and general obstetric precautions including but not limited to vaginal bleeding, contractions, leaking of fluid and fetal movement were reviewed in detail with the patient.  All questions were answered. Has home bp cuff. Check bp weekly, let us know if >140/90.  ? ?Follow-up: Return in about 3 weeks (around 03/01/2022) for PN2/LROB. ? ?No orders of the defined types were placed in this encounter. ? ?Christin Fudge DNP, CNM ?02/08/2022 ?11:00 AM ? ?

## 2022-02-08 NOTE — Patient Instructions (Signed)

## 2022-02-14 ENCOUNTER — Encounter: Payer: Self-pay | Admitting: Women's Health

## 2022-03-01 ENCOUNTER — Encounter: Payer: Self-pay | Admitting: Women's Health

## 2022-03-01 ENCOUNTER — Ambulatory Visit (INDEPENDENT_AMBULATORY_CARE_PROVIDER_SITE_OTHER): Payer: Medicaid Other | Admitting: Women's Health

## 2022-03-01 ENCOUNTER — Other Ambulatory Visit: Payer: Medicaid Other

## 2022-03-01 VITALS — BP 125/69 | HR 90 | Wt 174.6 lb

## 2022-03-01 DIAGNOSIS — Z3402 Encounter for supervision of normal first pregnancy, second trimester: Secondary | ICD-10-CM

## 2022-03-01 DIAGNOSIS — Z131 Encounter for screening for diabetes mellitus: Secondary | ICD-10-CM

## 2022-03-01 DIAGNOSIS — Z029 Encounter for administrative examinations, unspecified: Secondary | ICD-10-CM

## 2022-03-01 DIAGNOSIS — Z3A27 27 weeks gestation of pregnancy: Secondary | ICD-10-CM

## 2022-03-01 NOTE — Patient Instructions (Signed)
Jackie Brennan, thank you for choosing our office today! We appreciate the opportunity to meet your healthcare needs. You may receive a short survey by mail, e-mail, or through MyChart. If you are happy with your care we would appreciate if you could take just a few minutes to complete the survey questions. We read all of your comments and take your feedback very seriously. Thank you again for choosing our office.  ?[facility] ? ?[facility] ?([address] [place], [place] [address]) ?Entrance C, located off of [address] ?Free 24/7 valet parking  ? ?CLASSES: Go to [email or web address] to register for classes (childbirth, breastfeeding, waterbirth, infant CPR, daddy bootcamp, etc.) ? ?Call the office ([phone]) or go to [facility] if: ?You begin to have strong, frequent contractions ?Your water breaks.  Sometimes it is a big gush of fluid, sometimes it is just a trickle that keeps getting your panties wet or running down your legs ?You have vaginal bleeding.  It is normal to have a small amount of spotting if your cervix was checked.  ?You don't feel your baby moving like normal.  If you don't, get you something to eat and drink and lay down and focus on feeling your baby move.   If your baby is still not moving like normal, you should call the office or go to [facility]. ? ?Call the office ([phone]) or go to [facility] for these signs of pre-eclampsia: ?Severe headache that does not go away with Tylenol ?Visual changes- seeing spots, double, blurred vision ?Pain under your right breast or upper abdomen that does not go away with Tums or heartburn medicine ?Nausea and/or vomiting ?Severe swelling in your hands, feet, and face  ? ?Tdap Vaccine ?It is recommended that you get the Tdap vaccine during the third trimester of EACH pregnancy to help protect your baby from getting pertussis (whooping cough) ?27-36 weeks is the BEST time to do  this so that you can pass the protection on to your baby. During pregnancy is better than after pregnancy, but if you are unable to get it during pregnancy it will be offered at the hospital.  ?You can get this vaccine with us, at the health department, your family doctor, or some local pharmacies ?Everyone who will be around your baby should also be up-to-date on their vaccines before the baby comes. Adults (who are not pregnant) only need 1 dose of Tdap during adulthood.  ? ?[place] Pediatricians/Family Doctors ?[facility] ([facility]): [address], [phone]           ?[facility]: [address], [phone]                ?[facility] ([facility]): [address], [phone] (call to ask if accepting patients) ?[facility]: [address], [place], [phone]   ? ?[place] Pediatricians/Family Doctors ?[facility] ([facility]): [address], [phone] ?[facility]: [address], [phone] ?[facility]: [address], [phone] ? ?[place] [facility]  ?[facility] ([facility]): [phone] ?[facility]: [address], [phone]  ? ?[place] Family Doctors ?[facility]: [address], [phone] ? ?[place] Family Doctors  ?[facility]: [address], [phone] ? ?Home Blood Pressure Monitoring for Patients  ? ?Your provider has recommended that you check your   blood pressure (BP) at least once a week at home. If you do not have a blood pressure cuff at home, one will be provided for you. Contact your provider if you have not received your monitor within 1 week.  ? ?Helpful Tips for Accurate Home Blood Pressure Checks  ?Don't smoke, exercise, or drink caffeine 30 minutes before checking your BP ?Use the restroom before checking your BP (a full bladder can raise your  pressure) ?Relax in a comfortable upright chair ?Feet on the ground ?Left arm resting comfortably on a flat surface at the level of your heart ?Legs uncrossed ?Back supported ?Sit quietly and don't talk ?Place the cuff on your bare arm ?Adjust snuggly, so that only two fingertips can fit between your skin and the top of the cuff ?Check 2 readings separated by at least one minute ?Keep a log of your BP readings ?For a visual, please reference this diagram: http://ccnc.care/bpdiagram ? ?Provider Name: [facility]     Phone: [phone] ? ?Zone 1: ALL CLEAR  ?Continue to monitor your symptoms:  ?BP reading is less than 140 (top number) or less than 90 (bottom number)  ?No right upper stomach pain ?No headaches or seeing spots ?No feeling nauseated or throwing up ?No swelling in face and hands ? ?Zone 2: CAUTION ?Call your doctor's office for any of the following:  ?BP reading is greater than 140 (top number) or greater than 90 (bottom number)  ?Stomach pain under your ribs in the middle or right side ?Headaches or seeing spots ?Feeling nauseated or throwing up ?Swelling in face and hands ? ?Zone 3: EMERGENCY  ?Seek immediate medical care if you have any of the following:  ?BP reading is greater than160 (top number) or greater than 110 (bottom number) ?Severe headaches not improving with Tylenol ?Serious difficulty catching your breath ?Any worsening symptoms from Zone 2  ? ?Third Trimester of Pregnancy ?The third trimester is from week 29 through week 42, months 7 through 9. The third trimester is a time when the fetus is growing rapidly. At the end of the ninth month, the fetus is about 20 inches in length and weighs 6-10 pounds.  ?BODY CHANGES ?Your body goes through many changes during pregnancy. The changes vary from woman to woman.  ?Your weight will continue to increase. You can expect to gain 25-35 pounds (11-16 kg) by the end of the pregnancy. ?You may begin to get stretch marks on your hips, abdomen,  and breasts. ?You may urinate more often because the fetus is moving lower into your pelvis and pressing on your bladder. ?You may develop or continue to have heartburn as a result of your pregnancy. ?You may develop constipation because certain hormones are causing the muscles that push waste through your intestines to slow down. ?You may develop hemorrhoids or swollen, bulging veins (varicose veins). ?You may have pelvic pain because of the weight gain and pregnancy hormones relaxing your joints between the bones in your pelvis. Backaches may result from overexertion of the muscles supporting your posture. ?You may have changes in your hair. These can include thickening of your hair, rapid growth, and changes in texture. Some women also have hair loss during or after pregnancy, or hair that feels dry or thin. Your hair will most likely return to normal after your baby is born. ?Your breasts will continue to grow and be tender. A yellow discharge may leak from your breasts called colostrum. ?Your belly button may stick out. ?You may   feel short of breath because of your expanding uterus. ?You may notice the fetus "dropping," or moving lower in your abdomen. ?You may have a bloody mucus discharge. This usually occurs a few days to a week before labor begins. ?Your cervix becomes thin and soft (effaced) near your due date. ?WHAT TO EXPECT AT YOUR PRENATAL EXAMS  ?You will have prenatal exams every 2 weeks until week 36. Then, you will have weekly prenatal exams. During a routine prenatal visit: ?You will be weighed to make sure you and the fetus are growing normally. ?Your blood pressure is taken. ?Your abdomen will be measured to track your baby's growth. ?The fetal heartbeat will be listened to. ?Any test results from the previous visit will be discussed. ?You may have a cervical check near your due date to see if you have effaced. ?At around 36 weeks, your caregiver will check your cervix. At the same time, your  caregiver will also perform a test on the secretions of the vaginal tissue. This test is to determine if a type of bacteria, Group B streptococcus, is present. Your caregiver will explain this further. ?Your

## 2022-03-01 NOTE — Progress Notes (Signed)
? ? ?LOW-RISK PREGNANCY VISIT ?Patient name: Jackie Brennan MRN [id number]  Date of birth: 2001-04-05 ?Chief Complaint:   ?Routine Prenatal Visit and PN2 ? ?History of Present Illness:   ?Jackie Brennan is a 21 y.o. G1P0000 female at [redacted]w[redacted]d with an Estimated Date of Delivery: 05/29/22 being seen today for ongoing management of a low-risk pregnancy.  ? ?Today she reports no complaints. Contractions: Not present. Vag. Bleeding: None.  Movement: Present. denies leaking of fluid. ? ? ?  03/01/2022  ?  9:45 AM 11/24/2021  ? 11:32 AM 11/12/2019  ?  3:47 PM  ?Depression screen PHQ 2/9  ?Decreased Interest 0 0 0  ?Down, Depressed, Hopeless 0 0 0  ?PHQ - 2 Score 0 0 0  ?Altered sleeping 1 1   ?Tired, decreased energy 1 1   ?Change in appetite 0 0   ?Feeling bad or failure about yourself  0 0   ?Trouble concentrating 0 0   ?Moving slowly or fidgety/restless 0 0   ?Suicidal thoughts 0 0   ?PHQ-9 Score 2 2   ? ?  ? ?  03/01/2022  ?  9:45 AM 11/24/2021  ? 11:32 AM  ?GAD 7 : Generalized Anxiety Score  ?Nervous, Anxious, on Edge 0 1  ?Control/stop worrying 0 1  ?Worry too much - different things 0 1  ?Trouble relaxing 0 0  ?Restless 0 0  ?Easily annoyed or irritable 0 0  ?Afraid - awful might happen 0 0  ?Total GAD 7 Score 0 3  ? ? ?  ?Review of Systems:   ?Pertinent items are noted in HPI ?Denies abnormal vaginal discharge w/ itching/odor/irritation, headaches, visual changes, shortness of breath, chest pain, abdominal pain, severe nausea/vomiting, or problems with urination or bowel movements unless otherwise stated above. ?Pertinent History Reviewed:  ?Reviewed past medical,surgical, social, obstetrical and family history.  ?Reviewed problem list, medications and allergies. ?Physical Assessment:  ? ?Vitals:  ? 03/01/22 0947  ?BP: 125/69  ?Pulse: 90  ?Weight: 174 lb 9.6 oz (79.2 kg)  ?Body mass index is 31.93 kg/m?. ?  ?     Physical Examination:  ? General appearance: Well appearing, and in no distress ? Mental status: Alert,  oriented to person, place, and time ? Skin: Warm & dry ? Cardiovascular: Normal heart rate noted ? Respiratory: Normal respiratory effort, no distress ? Abdomen: Soft, gravid, nontender ? Pelvic: Cervical exam deferred        ? Extremities: Edema: None ? ?Fetal Status: Fetal Heart Rate (bpm): 132 Fundal Height: 26 cm Movement: Present   ? ?Chaperone: N/A   ?No results found for this or any previous visit (from the past 24 hour(s)).  ?Assessment & Plan:  ?1) Low-risk pregnancy G1P0000 at [redacted]w[redacted]d with an Estimated Date of Delivery: 05/29/22  ?  ?Meds: No orders of the defined types were placed in this encounter. ? ?Labs/procedures today: PN2 and declined tdap today, wants to think abou it ? ?Plan:  Continue routine obstetrical care  ?Next visit: prefers in person   ? ?Reviewed: Preterm labor symptoms and general obstetric precautions including but not limited to vaginal bleeding, contractions, leaking of fluid and fetal movement were reviewed in detail with the patient.  All questions were answered. Does have home bp cuff. Office bp cuff given: not applicable. Check bp weekly, let us know if consistently >140 and/or >90. ? ?Follow-up: Return in about 3 weeks (around 03/22/2022) for [place], [place], in person. ? ?No future appointments. ? ?No orders of the defined  types were placed in this encounter. ? ?Roma Schanz CNM, WHNP-BC ?03/01/2022 ?10:15 AM  ?

## 2022-03-02 ENCOUNTER — Encounter: Payer: Self-pay | Admitting: *Deleted

## 2022-03-02 LAB — RPR: RPR Ser Ql: NONREACTIVE

## 2022-03-02 LAB — CBC
Hematocrit: 32.6 % — ABNORMAL LOW (ref 34.0–46.6)
Hemoglobin: 11.1 g/dL (ref 11.1–15.9)
MCH: 30.1 pg (ref 26.6–33.0)
MCHC: 34 g/dL (ref 31.5–35.7)
MCV: 88 fL (ref 79–97)
Platelets: 208 10*3/uL (ref 150–450)
RBC: 3.69 x10E6/uL — ABNORMAL LOW (ref 3.77–5.28)
RDW: 13.3 % (ref 11.7–15.4)
WBC: 8.8 10*3/uL (ref 3.4–10.8)

## 2022-03-02 LAB — GLUCOSE TOLERANCE, 2 HOURS W/ 1HR
Glucose, 1 hour: 129 mg/dL (ref 70–179)
Glucose, 2 hour: 110 mg/dL (ref 70–152)
Glucose, Fasting: 74 mg/dL (ref 70–91)

## 2022-03-02 LAB — ANTIBODY SCREEN: Antibody Screen: NEGATIVE

## 2022-03-02 LAB — HIV ANTIBODY (ROUTINE TESTING W REFLEX): HIV Screen 4th Generation wRfx: NONREACTIVE

## 2022-03-22 ENCOUNTER — Encounter: Payer: Self-pay | Admitting: Advanced Practice Midwife

## 2022-03-22 ENCOUNTER — Ambulatory Visit (INDEPENDENT_AMBULATORY_CARE_PROVIDER_SITE_OTHER): Payer: Medicaid Other | Admitting: Advanced Practice Midwife

## 2022-03-22 VITALS — BP 127/71 | HR 91 | Wt 179.0 lb

## 2022-03-22 DIAGNOSIS — Z3403 Encounter for supervision of normal first pregnancy, third trimester: Secondary | ICD-10-CM

## 2022-03-22 DIAGNOSIS — Z3A30 30 weeks gestation of pregnancy: Secondary | ICD-10-CM

## 2022-03-22 NOTE — Progress Notes (Signed)
   LOW-RISK PREGNANCY VISIT Patient name: Jackie Brennan MRN [id number]  Date of birth: 02-14-2001 Chief Complaint:   Routine Prenatal Visit  History of Present Illness:   Jackie Brennan is a 21 y.o. G14P0000 female at [redacted]w[redacted]d with an Estimated Date of Delivery: 05/29/22 being seen today for ongoing management of a low-risk pregnancy.  Today she reports no complaints. Contractions: Not present. Vag. Bleeding: None.  Movement: Present. denies leaking of fluid. Review of Systems:   Pertinent items are noted in HPI Denies abnormal vaginal discharge w/ itching/odor/irritation, headaches, visual changes, shortness of breath, chest pain, abdominal pain, severe nausea/vomiting, or problems with urination or bowel movements unless otherwise stated above. Pertinent History Reviewed:  Reviewed past medical,surgical, social, obstetrical and family history.  Reviewed problem list, medications and allergies. Physical Assessment:   Vitals:   03/22/22 0910  BP: 127/71  Pulse: 91  Weight: 179 lb (81.2 kg)  Body mass index is 32.74 kg/m.        Physical Examination:   General appearance: Well appearing, and in no distress  Mental status: Alert, oriented to person, place, and time  Skin: Warm & dry  Cardiovascular: Normal heart rate noted  Respiratory: Normal respiratory effort, no distress  Abdomen: Soft, gravid, nontender  Pelvic: Cervical exam deferred         Extremities: Edema: None  Fetal Status: Fetal Heart Rate (bpm): 140 Fundal Height: 28 cm Movement: Present    Chaperone: n/a    No results found for this or any previous visit (from the past 24 hour(s)).  Assessment & Plan:  1) Low-risk pregnancy G1P0000 at [redacted]w[redacted]d with an Estimated Date of Delivery: 05/29/22     Meds: No orders of the defined types were placed in this encounter.  Labs/procedures today: none  Plan:  Continue routine obstetrical care  Next visit: prefers Mychart  Reviewed: Preterm labor symptoms and general  obstetric precautions including but not limited to vaginal bleeding, contractions, leaking of fluid and fetal movement were reviewed in detail with the patient.  All questions were answered. Has home bp cuff.. Check bp weekly, let us know if >140/90.   Follow-up: Return in about 2 weeks (around 04/05/2022) for LROB, OB Mychart visit.  No orders of the defined types were placed in this encounter.  [name] DNP, CNM 03/22/2022 9:21 AM

## 2022-04-12 ENCOUNTER — Encounter: Payer: Self-pay | Admitting: Advanced Practice Midwife

## 2022-04-12 ENCOUNTER — Telehealth (INDEPENDENT_AMBULATORY_CARE_PROVIDER_SITE_OTHER): Payer: Medicaid Other | Admitting: Advanced Practice Midwife

## 2022-04-12 VITALS — BP 124/78 | HR 93

## 2022-04-12 DIAGNOSIS — Z3A33 33 weeks gestation of pregnancy: Secondary | ICD-10-CM

## 2022-04-12 DIAGNOSIS — Z3403 Encounter for supervision of normal first pregnancy, third trimester: Secondary | ICD-10-CM

## 2022-04-12 NOTE — Progress Notes (Signed)
   I connected with Prim Morace 04/12/22 at  8:50 AM EDT by: MyChart video and verified that I am speaking with the correct person using two identifiers.  Patient is located at home and provider is located at home, provider at the office.    The purpose of this virtual visit is to provide medical care while limiting exposure to the novel coronavirus. I discussed the limitations, risks, security and privacy concerns of performing an evaluation and management service by MyChart video and the availability of in person appointments. I also discussed with the patient that there may be a patient responsible charge related to this service. By engaging in this virtual visit, you consent to the provision of healthcare.  Additionally, you authorize for your insurance to be billed for the services provided during this visit.  The patient expressed understanding and agreed to proceed.  The following staff members participated in the virtual visit:  Jobe Marker, RN    TELE-PRENATAL VISIT NOTE  Subjective:  Deadra Diggins is a 21 y.o. G1P0000 at [redacted]w[redacted]d  for phone visit for ongoing prenatal care.  She is currently monitored for the following issues for this low-risk pregnancy and has Acne; Supervision of normal first pregnancy; and Herpes on their problem list.  Patient reports backache.  Contractions: Not present. Vag. Bleeding: None.  Movement: Present. Denies leaking of fluid.   The following portions of the patient's history were reviewed and updated as appropriate: allergies, current medications, past family history, past medical history, past social history, past surgical history and problem list.   Objective:   Vitals:   04/12/22 0843  BP: 124/78  Pulse: 93   Self-Obtained  Fetal Status:     Movement: Present     Assessment and Plan:  Pregnancy: G1P0000 at [redacted]w[redacted]d 1. Encounter for supervision of normal first pregnancy in third trimester   Preterm labor symptoms and general obstetric  precautions including but not limited to vaginal bleeding, contractions, leaking of fluid and fetal movement were reviewed in detail with the patient.  No follow-ups on file.  No future appointments.   Time spent on virtual visit: 15 minutes  [name], CNM

## 2022-04-12 NOTE — Patient Instructions (Signed)
For your lower back pain you may: Purchase a pregnancy/maternity support belt from [employer], [employer], [employer], [employer], etc and wear it while you are up and about Take warm baths Use a heating pad to your lower back for no longer than 20 minutes at a time, and do not place near abdomen Take tylenol as needed. Please follow directions on the bottle Kinesiology tape (can get from sporting goods store), google how to tape belly for pregnancy.  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

## 2022-04-25 ENCOUNTER — Encounter: Payer: Self-pay | Admitting: Medical

## 2022-04-25 ENCOUNTER — Telehealth (INDEPENDENT_AMBULATORY_CARE_PROVIDER_SITE_OTHER): Payer: Medicaid Other | Admitting: Medical

## 2022-04-25 VITALS — BP 125/83 | HR 93

## 2022-04-25 DIAGNOSIS — O98313 Other infections with a predominantly sexual mode of transmission complicating pregnancy, third trimester: Secondary | ICD-10-CM

## 2022-04-25 DIAGNOSIS — Z3403 Encounter for supervision of normal first pregnancy, third trimester: Secondary | ICD-10-CM

## 2022-04-25 DIAGNOSIS — Z3A35 35 weeks gestation of pregnancy: Secondary | ICD-10-CM

## 2022-04-25 DIAGNOSIS — B009 Herpesviral infection, unspecified: Secondary | ICD-10-CM

## 2022-04-25 NOTE — Progress Notes (Signed)
I connected with Jackie Brennan 04/25/22 at  2:30 PM EDT by: MyChart video and verified that I am speaking with the correct person using two identifiers.  Patient is located at home and provider is located at [facility].     I discussed the limitations, risks, security and privacy concerns of performing an evaluation and management service by MyChart video and the availability of in person appointments. I also discussed with the patient that there may be a patient responsible charge related to this service. By engaging in this virtual visit, you consent to the provision of healthcare.  Additionally, you authorize for your insurance to be billed for the services provided during this visit.  The patient expressed understanding and agreed to proceed.  The following staff members participated in the virtual visit:  Jackie Marker, RN    PRENATAL VISIT NOTE  Subjective:  Jackie Brennan is a 21 y.o. G1P0000 at [redacted]w[redacted]d  for virtual visit for ongoing prenatal care.  She is currently monitored for the following issues for this low-risk pregnancy and has Acne; Supervision of normal first pregnancy; and Herpes on their problem list.  Patient reports occasional contractions.  Contractions: Not present. Vag. Bleeding: None.  Movement: Present. Denies leaking of fluid.   The following portions of the patient's history were reviewed and updated as appropriate: allergies, current medications, past family history, past medical history, past social history, past surgical history and problem list.   Objective:   Vitals:   04/25/22 1416  BP: 125/83  Pulse: 93   Self-Obtained  Fetal Status:     Movement: Present     Assessment and Plan:  Pregnancy: G1P0000 at 103w1d 1. Encounter for supervision of normal first pregnancy in third trimester - Anticipatory guidance for GBS, GC/CT at next visit discussed   2. Herpes - On valtrex   3. [redacted] weeks gestation of pregnancy  Preterm labor symptoms and general  obstetric precautions including but not limited to vaginal bleeding, contractions, leaking of fluid and fetal movement were reviewed in detail with the patient.  Return in about 1 week (around 05/02/2022) for LOB, In-Person.  No future appointments.   Time spent on virtual visit: 15 minutes  Jackie Nipple, PA-C

## 2022-05-04 ENCOUNTER — Other Ambulatory Visit (HOSPITAL_COMMUNITY)
Admission: RE | Admit: 2022-05-04 | Discharge: 2022-05-04 | Disposition: A | Discharge status: Home / Self Care | Payer: Medicaid Other | Source: Ambulatory Visit | Attending: Obstetrics & Gynecology | Admitting: Obstetrics & Gynecology

## 2022-05-04 ENCOUNTER — Ambulatory Visit (INDEPENDENT_AMBULATORY_CARE_PROVIDER_SITE_OTHER): Payer: Medicaid Other | Admitting: Obstetrics & Gynecology

## 2022-05-04 ENCOUNTER — Encounter: Payer: Self-pay | Admitting: Obstetrics & Gynecology

## 2022-05-04 VITALS — BP 136/85 | HR 81 | Wt 201.2 lb

## 2022-05-04 DIAGNOSIS — Z3A36 36 weeks gestation of pregnancy: Secondary | ICD-10-CM | POA: Insufficient documentation

## 2022-05-04 DIAGNOSIS — Z3403 Encounter for supervision of normal first pregnancy, third trimester: Secondary | ICD-10-CM | POA: Diagnosis present

## 2022-05-04 NOTE — Progress Notes (Signed)
   LOW-RISK PREGNANCY VISIT Patient name: Jackie Brennan MRN [id number]  Date of birth: 13-Mar-2001 Chief Complaint:   Routine Prenatal Visit  History of Present Illness:   Jackie Brennan is a 21 y.o. G9P0000 female at [redacted]w[redacted]d with an Estimated Date of Delivery: 05/29/22 being seen today for ongoing management of a low-risk pregnancy.      03/01/2022    9:45 AM 11/24/2021   11:32 AM 11/12/2019    3:47 PM  Depression screen PHQ 2/9  Decreased Interest 0 0 0  Down, Depressed, Hopeless 0 0 0  PHQ - 2 Score 0 0 0  Altered sleeping 1 1   Tired, decreased energy 1 1   Change in appetite 0 0   Feeling bad or failure about yourself  0 0   Trouble concentrating 0 0   Moving slowly or fidgety/restless 0 0   Suicidal thoughts 0 0   PHQ-9 Score 2 2     Today she reports no complaints. Contractions: Not present.  .  Movement: Present. denies leaking of fluid. Review of Systems:   Pertinent items are noted in HPI Denies abnormal vaginal discharge w/ itching/odor/irritation, headaches, visual changes, shortness of breath, chest pain, abdominal pain, severe nausea/vomiting, or problems with urination or bowel movements unless otherwise stated above. Pertinent History Reviewed:  Reviewed past medical,surgical, social, obstetrical and family history.  Reviewed problem list, medications and allergies.  Physical Assessment:   Vitals:   05/04/22 1227  BP: 136/85  Pulse: 81  Weight: 201 lb 3.2 oz (91.3 kg)  Body mass index is 36.8 kg/m.        Physical Examination:   General appearance: Well appearing, and in no distress  Mental status: Alert, oriented to person, place, and time  Skin: Warm & dry  Respiratory: Normal respiratory effort, no distress  Abdomen: Soft, gravid, nontender  Pelvic: Cervical exam performed  Dilation: Closed Effacement (%): Thick Station: -3  Extremities: Edema: Trace  Psych:  mood and affect appropriate  Fetal Status: Fetal Heart Rate (bpm): 135 Fundal Height: 35  cm Movement: Present    Chaperone: Malachy Mood    No results found for this or any previous visit (from the past 24 hour(s)).   Assessment & Plan:  1) Low-risk pregnancy G1P0000 at [redacted]w[redacted]d with an Estimated Date of Delivery: 05/29/22   -HSV2 Asymptomatic, on suppression   Meds: No orders of the defined types were placed in this encounter.  Labs/procedures today: GC/C, GBS  Plan:  Continue routine obstetrical care  Next visit: prefers in person    Reviewed: Preterm labor symptoms and general obstetric precautions including but not limited to vaginal bleeding, contractions, leaking of fluid and fetal movement were reviewed in detail with the patient.  All questions were answered. Pt has home bp cuff. Check bp weekly, let us know if >140/90.   Follow-up: Return in about 1 week (around 05/11/2022) for LROB visit.  Orders Placed This Encounter  Procedures   Strep Gp B NAA    Myna Hidalgo, DO Attending Obstetrician & Gynecologist, [facility] for [employer], [facility]

## 2022-05-07 LAB — CERVICOVAGINAL ANCILLARY ONLY
Chlamydia: NEGATIVE
Comment: NEGATIVE
Comment: NORMAL
Neisseria Gonorrhea: NEGATIVE

## 2022-05-07 LAB — STREP GP B NAA: Strep Gp B NAA: POSITIVE — AB

## 2022-05-11 ENCOUNTER — Ambulatory Visit (INDEPENDENT_AMBULATORY_CARE_PROVIDER_SITE_OTHER): Payer: Medicaid Other | Admitting: Obstetrics & Gynecology

## 2022-05-11 ENCOUNTER — Encounter: Payer: Self-pay | Admitting: Obstetrics & Gynecology

## 2022-05-11 ENCOUNTER — Other Ambulatory Visit: Payer: Self-pay

## 2022-05-11 ENCOUNTER — Inpatient Hospital Stay (HOSPITAL_COMMUNITY)
Admission: AD | Admit: 2022-05-11 | Discharge: 2022-05-14 | DRG: 806 | Disposition: A | Discharge status: Home / Self Care | Payer: Medicaid Other | Attending: Family Medicine | Admitting: Family Medicine

## 2022-05-11 ENCOUNTER — Encounter (HOSPITAL_COMMUNITY): Payer: Self-pay | Admitting: Obstetrics & Gynecology

## 2022-05-11 VITALS — BP 131/84 | HR 96 | Wt 205.0 lb

## 2022-05-11 DIAGNOSIS — A6000 Herpesviral infection of urogenital system, unspecified: Secondary | ICD-10-CM | POA: Diagnosis present

## 2022-05-11 DIAGNOSIS — Z3A37 37 weeks gestation of pregnancy: Secondary | ICD-10-CM | POA: Diagnosis not present

## 2022-05-11 DIAGNOSIS — R030 Elevated blood-pressure reading, without diagnosis of hypertension: Secondary | ICD-10-CM

## 2022-05-11 DIAGNOSIS — Z3403 Encounter for supervision of normal first pregnancy, third trimester: Principal | ICD-10-CM

## 2022-05-11 DIAGNOSIS — O321XX0 Maternal care for breech presentation, not applicable or unspecified: Secondary | ICD-10-CM | POA: Diagnosis present

## 2022-05-11 DIAGNOSIS — O1414 Severe pre-eclampsia complicating childbirth: Secondary | ICD-10-CM | POA: Diagnosis present

## 2022-05-11 DIAGNOSIS — O99824 Streptococcus B carrier state complicating childbirth: Secondary | ICD-10-CM | POA: Diagnosis present

## 2022-05-11 DIAGNOSIS — Z87891 Personal history of nicotine dependence: Secondary | ICD-10-CM | POA: Diagnosis not present

## 2022-05-11 DIAGNOSIS — O9832 Other infections with a predominantly sexual mode of transmission complicating childbirth: Secondary | ICD-10-CM | POA: Diagnosis present

## 2022-05-11 DIAGNOSIS — O1404 Mild to moderate pre-eclampsia, complicating childbirth: Secondary | ICD-10-CM | POA: Diagnosis not present

## 2022-05-11 DIAGNOSIS — O99820 Streptococcus B carrier state complicating pregnancy: Secondary | ICD-10-CM | POA: Diagnosis not present

## 2022-05-11 DIAGNOSIS — O1493 Unspecified pre-eclampsia, third trimester: Secondary | ICD-10-CM | POA: Diagnosis present

## 2022-05-11 LAB — POCT URINALYSIS DIPSTICK OB
Blood, UA: NEGATIVE
Glucose, UA: NEGATIVE
Ketones, UA: NEGATIVE
Leukocytes, UA: NEGATIVE
Nitrite, UA: NEGATIVE

## 2022-05-11 LAB — PROTEIN / CREATININE RATIO, URINE
Creatinine, Urine: 361.7 mg/dL
Protein Creatinine Ratio: 1.92 mg/mg{Cre} — ABNORMAL HIGH (ref 0.00–0.15)
Total Protein, Urine: 693 mg/dL

## 2022-05-11 LAB — CBC
HCT: 32.1 % — ABNORMAL LOW (ref 36.0–46.0)
Hemoglobin: 10.8 g/dL — ABNORMAL LOW (ref 12.0–15.0)
MCH: 29.2 pg (ref 26.0–34.0)
MCHC: 33.6 g/dL (ref 30.0–36.0)
MCV: 86.8 fL (ref 80.0–100.0)
Platelets: 202 10*3/uL (ref 150–400)
RBC: 3.7 MIL/uL — ABNORMAL LOW (ref 3.87–5.11)
RDW: 13.7 % (ref 11.5–15.5)
WBC: 7.5 10*3/uL (ref 4.0–10.5)
nRBC: 0.4 % — ABNORMAL HIGH (ref 0.0–0.2)

## 2022-05-11 LAB — COMPREHENSIVE METABOLIC PANEL
ALT: 14 U/L (ref 0–44)
AST: 21 U/L (ref 15–41)
Albumin: 2.6 g/dL — ABNORMAL LOW (ref 3.5–5.0)
Alkaline Phosphatase: 191 U/L — ABNORMAL HIGH (ref 38–126)
Anion gap: 10 (ref 5–15)
BUN: 11 mg/dL (ref 6–20)
CO2: 19 mmol/L — ABNORMAL LOW (ref 22–32)
Calcium: 9.1 mg/dL (ref 8.9–10.3)
Chloride: 107 mmol/L (ref 98–111)
Creatinine, Ser: 1.06 mg/dL — ABNORMAL HIGH (ref 0.44–1.00)
GFR, Estimated: >60 mL/min (ref >60)
Glucose, Bld: 118 mg/dL — ABNORMAL HIGH (ref 70–99)
Potassium: 3.8 mmol/L (ref 3.5–5.1)
Sodium: 136 mmol/L (ref 135–145)
Total Bilirubin: 0.6 mg/dL (ref 0.3–1.2)
Total Protein: 6.1 g/dL — ABNORMAL LOW (ref 6.5–8.1)

## 2022-05-11 LAB — TYPE AND SCREEN
ABO/RH(D): B POS
Antibody Screen: NEGATIVE

## 2022-05-11 MED ORDER — PENICILLIN G POT IN DEXTROSE 60000 UNIT/ML IV SOLN: 3.0000 10*6.[IU] | INTRAVENOUS | Status: DC

## 2022-05-11 MED ORDER — LACTATED RINGERS IV SOLN: INTRAVENOUS | Status: DC

## 2022-05-11 MED ORDER — MISOPROSTOL 50MCG HALF TABLET: 50.0000 ug | ORAL_TABLET | ORAL | Status: DC

## 2022-05-11 MED ORDER — SODIUM CHLORIDE 0.9 % IV SOLN: 5.0000 10*6.[IU] | Freq: Once | INTRAVENOUS | Status: DC

## 2022-05-11 NOTE — Progress Notes (Signed)
LOW-RISK PREGNANCY VISIT Patient name: Jackie Brennan MRN [id number]  Date of birth: 07-18-2001 Chief Complaint:   Routine Prenatal Visit  History of Present Illness:   Jackie Brennan is a 21 y.o. G61P0000 female at [redacted]w[redacted]d with an Estimated Date of Delivery: 05/29/22 being seen today for ongoing management of a low-risk pregnancy.     03/01/2022    9:45 AM 11/24/2021   11:32 AM 11/12/2019    3:47 PM  Depression screen PHQ 2/9  Decreased Interest 0 0 0  Down, Depressed, Hopeless 0 0 0  PHQ - 2 Score 0 0 0  Altered sleeping 1 1   Tired, decreased energy 1 1   Change in appetite 0 0   Feeling bad or failure about yourself  0 0   Trouble concentrating 0 0   Moving slowly or fidgety/restless 0 0   Suicidal thoughts 0 0   PHQ-9 Score 2 2     Today she reports no complaints. Contractions: Not present. Vag. Bleeding: None.  Movement: Present. denies leaking of fluid. Review of Systems:   Pertinent items are noted in HPI Denies abnormal vaginal discharge w/ itching/odor/irritation, headaches, visual changes, shortness of breath, chest pain, abdominal pain, severe nausea/vomiting, or problems with urination or bowel movements unless otherwise stated above. Pertinent History Reviewed:  Reviewed past medical,surgical, social, obstetrical and family history.  Reviewed problem list, medications and allergies. Physical Assessment:   Vitals:   05/11/22 1256 05/11/22 1301  BP: (!) 134/95 131/84  Pulse: 96   Weight: 205 lb (93 kg)   Body mass index is 37.49 kg/m.        Physical Examination:   General appearance: Well appearing, and in no distress  Mental status: Alert, oriented to person, place, and time  Skin: Warm & dry  Cardiovascular: Normal heart rate noted  Respiratory: Normal respiratory effort, no distress  Abdomen: Soft, gravid, nontender  Pelvic: Cervical exam performed  Dilation: Closed Effacement (%): Thick Station: -3  Extremities: Edema: Moderate pitting, indentation  subsides rapidly  Fetal Status: Fetal Heart Rate (bpm): 143 Fundal Height: 36 cm Movement: Present Presentation: Vertex  Chaperone: Jobe Marker    Results for orders placed or performed in visit on 05/11/22 (from the past 24 hour(s))  POC Urinalysis Dipstick OB   Collection Time: 05/11/22  1:08 PM  Result Value Ref Range   Color, UA     Clarity, UA     Glucose, UA Negative Negative   Bilirubin, UA     Ketones, UA neg    Spec Grav, UA     Blood, UA neg    pH, UA     POC,PROTEIN,UA 4+ Negative, Trace, Small (1+), Moderate (2+), Large (3+), 4+   Urobilinogen, UA     Nitrite, UA neg    Leukocytes, UA Negative Negative   Appearance     Odor      Assessment & Plan:  1) Low-risk pregnancy G1P0000 at [redacted]w[redacted]d with an Estimated Date of Delivery: 05/29/22   2) Borderline BP noted with 4+ protein on urine dip today, recommend IOL for pre eclampsia, Dr Macon Large agrees with plan, would repeat BP 6 hours normally but with the protein, that makes that moot and unnecessary   Meds: No orders of the defined types were placed in this encounter.  Labs/procedures today:   Plan:  To [facility] at 7 pm for IOL Next visit: prefers in person      Follow-up: Return in about 10 days (around 05/21/2022) for  BP check , Nurse only.  Orders Placed This Encounter  Procedures   POC Urinalysis Dipstick OB    Lazaro Arms, MD 05/11/2022 1:33 PM

## 2022-05-11 NOTE — MAU Note (Signed)
.  Jackie Brennan is a 21 y.o. at [redacted]w[redacted]d here in MAU reporting: sent from the office for direct admission for schedule IOL for possible PreE. Pt denies DFM, LOF, VB and CTX.  GBS pos SVE closed  Pain score: 0/10 Vitals:   05/11/22 2013  BP: (!) 134/92  Pulse: 99  Resp: 18  Temp: 98.5 F (36.9 C)  SpO2: 99%     FHT:170 Lab orders placed from triage:  UA

## 2022-05-11 NOTE — MAU Note (Addendum)
Pt arrived as direct admit from Facility service for induction of labor for pre eclampsia. Room unavailable in LD, charge RN aware pt is being held in MAU.

## 2022-05-12 ENCOUNTER — Encounter (HOSPITAL_COMMUNITY): Payer: Self-pay | Admitting: Obstetrics & Gynecology

## 2022-05-12 DIAGNOSIS — O99820 Streptococcus B carrier state complicating pregnancy: Secondary | ICD-10-CM

## 2022-05-12 DIAGNOSIS — O1404 Mild to moderate pre-eclampsia, complicating childbirth: Secondary | ICD-10-CM

## 2022-05-12 DIAGNOSIS — O1493 Unspecified pre-eclampsia, third trimester: Secondary | ICD-10-CM | POA: Diagnosis present

## 2022-05-12 DIAGNOSIS — O9832 Other infections with a predominantly sexual mode of transmission complicating childbirth: Secondary | ICD-10-CM

## 2022-05-12 DIAGNOSIS — Z3A37 37 weeks gestation of pregnancy: Secondary | ICD-10-CM

## 2022-05-12 LAB — COMPREHENSIVE METABOLIC PANEL
ALT: 14 U/L (ref 0–44)
AST: 29 U/L (ref 15–41)
Albumin: 2.6 g/dL — ABNORMAL LOW (ref 3.5–5.0)
Alkaline Phosphatase: 226 U/L — ABNORMAL HIGH (ref 38–126)
Anion gap: 9 (ref 5–15)
BUN: 10 mg/dL (ref 6–20)
CO2: 19 mmol/L — ABNORMAL LOW (ref 22–32)
Calcium: 8.6 mg/dL — ABNORMAL LOW (ref 8.9–10.3)
Chloride: 107 mmol/L (ref 98–111)
Creatinine, Ser: 0.9 mg/dL (ref 0.44–1.00)
GFR, Estimated: >60 mL/min (ref >60)
Glucose, Bld: 105 mg/dL — ABNORMAL HIGH (ref 70–99)
Potassium: 4.3 mmol/L (ref 3.5–5.1)
Sodium: 135 mmol/L (ref 135–145)
Total Bilirubin: 0.6 mg/dL (ref 0.3–1.2)
Total Protein: 6.5 g/dL (ref 6.5–8.1)

## 2022-05-12 LAB — CBC
HCT: 33.8 % — ABNORMAL LOW (ref 36.0–46.0)
HCT: 27.6 % — ABNORMAL LOW (ref 36.0–46.0)
Hemoglobin: 11.2 g/dL — ABNORMAL LOW (ref 12.0–15.0)
Hemoglobin: 9.1 g/dL — ABNORMAL LOW (ref 12.0–15.0)
MCH: 29.5 pg (ref 26.0–34.0)
MCH: 29.2 pg (ref 26.0–34.0)
MCHC: 33.1 g/dL (ref 30.0–36.0)
MCHC: 33 g/dL (ref 30.0–36.0)
MCV: 88.9 fL (ref 80.0–100.0)
MCV: 88.5 fL (ref 80.0–100.0)
Platelets: 206 K/uL (ref 150–400)
Platelets: 222 10*3/uL (ref 150–400)
RBC: 3.8 MIL/uL — ABNORMAL LOW (ref 3.87–5.11)
RBC: 3.12 MIL/uL — ABNORMAL LOW (ref 3.87–5.11)
RDW: 13.7 % (ref 11.5–15.5)
RDW: 13.7 % (ref 11.5–15.5)
WBC: 13.1 K/uL — ABNORMAL HIGH (ref 4.0–10.5)
WBC: 12.8 10*3/uL — ABNORMAL HIGH (ref 4.0–10.5)
nRBC: 0.2 % (ref 0.0–0.2)
nRBC: 0.2 % (ref 0.0–0.2)

## 2022-05-12 LAB — RPR: RPR Ser Ql: NONREACTIVE

## 2022-05-12 MED ORDER — PRENATAL MULTIVITAMIN CH
1.0000 | ORAL_TABLET | Freq: Every day | ORAL | Status: DC
  Administered 2022-05-12 – 2022-05-14 (×3): 1 via ORAL
  Filled 2022-05-12 (×3): qty 1

## 2022-05-12 MED ORDER — OXYTOCIN-SODIUM CHLORIDE 30-0.9 UT/500ML-% IV SOLN
2.5000 [IU]/h | INTRAVENOUS | Status: DC
  Administered 2022-05-12: 2.5 [IU]/h via INTRAVENOUS

## 2022-05-12 MED ORDER — HYDROXYZINE HCL 50 MG PO TABS: 50.0000 mg | ORAL_TABLET | Freq: Four times a day (QID) | ORAL | Status: DC | PRN

## 2022-05-12 MED ORDER — LABETALOL HCL 5 MG/ML IV SOLN
20.0000 mg | INTRAVENOUS | Status: DC | PRN
  Administered 2022-05-12: 20 mg via INTRAVENOUS

## 2022-05-12 MED ORDER — ACETAMINOPHEN 325 MG PO TABS
650.0000 mg | ORAL_TABLET | ORAL | Status: DC | PRN
  Administered 2022-05-12: 650 mg via ORAL
  Filled 2022-05-12: qty 2

## 2022-05-12 MED ORDER — IBUPROFEN 600 MG PO TABS
600.0000 mg | ORAL_TABLET | Freq: Four times a day (QID) | ORAL | Status: DC
  Administered 2022-05-12 – 2022-05-14 (×9): 600 mg via ORAL
  Filled 2022-05-12 (×9): qty 1

## 2022-05-12 MED ORDER — TETANUS-DIPHTH-ACELL PERTUSSIS 5-2.5-18.5 LF-MCG/0.5 IM SUSY: 0.5000 mL | PREFILLED_SYRINGE | Freq: Once | INTRAMUSCULAR | Status: DC

## 2022-05-12 MED ORDER — ONDANSETRON HCL 4 MG/2ML IJ SOLN: 4.0000 mg | INTRAMUSCULAR | Status: DC | PRN

## 2022-05-12 MED ORDER — DIPHENHYDRAMINE HCL 25 MG PO CAPS: 25.0000 mg | ORAL_CAPSULE | Freq: Four times a day (QID) | ORAL | Status: DC | PRN

## 2022-05-12 MED ORDER — BENZOCAINE-MENTHOL 20-0.5 % EX AERO: 1.0000 | INHALATION_SPRAY | CUTANEOUS | Status: DC | PRN

## 2022-05-12 MED ORDER — MISOPROSTOL 50MCG HALF TABLET
50.0000 ug | ORAL_TABLET | ORAL | Status: DC | PRN
  Administered 2022-05-12: 50 ug via BUCCAL
  Filled 2022-05-12 (×2): qty 1

## 2022-05-12 MED ORDER — SIMETHICONE 80 MG PO CHEW: 80.0000 mg | CHEWABLE_TABLET | ORAL | Status: DC | PRN

## 2022-05-12 MED ORDER — OXYTOCIN-SODIUM CHLORIDE 30-0.9 UT/500ML-% IV SOLN
1.0000 m[IU]/min | INTRAVENOUS | Status: DC
  Administered 2022-05-12: 2 m[IU]/min via INTRAVENOUS
  Filled 2022-05-12: qty 500

## 2022-05-12 MED ORDER — WITCH HAZEL-GLYCERIN EX PADS: 1.0000 | MEDICATED_PAD | CUTANEOUS | Status: DC | PRN

## 2022-05-12 MED ORDER — LIDOCAINE HCL (PF) 1 % IJ SOLN: 30.0000 mL | INTRAMUSCULAR | Status: DC | PRN

## 2022-05-12 MED ORDER — ONDANSETRON HCL 4 MG PO TABS: 4.0000 mg | ORAL_TABLET | ORAL | Status: DC | PRN

## 2022-05-12 MED ORDER — LABETALOL HCL 5 MG/ML IV SOLN
20.0000 mg | INTRAVENOUS | Status: DC | PRN
  Filled 2022-05-12: qty 4

## 2022-05-12 MED ORDER — FUROSEMIDE 20 MG PO TABS
20.0000 mg | ORAL_TABLET | Freq: Every day | ORAL | Status: DC
  Administered 2022-05-12 – 2022-05-14 (×3): 20 mg via ORAL
  Filled 2022-05-12 (×3): qty 1

## 2022-05-12 MED ORDER — MAGNESIUM SULFATE 40 GM/1000ML IV SOLN
2.0000 g/h | INTRAVENOUS | Status: DC
  Administered 2022-05-12: 2 g/h via INTRAVENOUS
  Filled 2022-05-12: qty 1000

## 2022-05-12 MED ORDER — LABETALOL HCL 5 MG/ML IV SOLN: 80.0000 mg | INTRAVENOUS | Status: DC | PRN

## 2022-05-12 MED ORDER — LABETALOL HCL 5 MG/ML IV SOLN: 40.0000 mg | INTRAVENOUS | Status: DC | PRN

## 2022-05-12 MED ORDER — OXYTOCIN BOLUS FROM INFUSION
333.0000 mL | Freq: Once | INTRAVENOUS | Status: AC
  Administered 2022-05-12: 333 mL via INTRAVENOUS

## 2022-05-12 MED ORDER — LACTATED RINGERS IV SOLN: 500.0000 mL | INTRAVENOUS | Status: DC | PRN

## 2022-05-12 MED ORDER — SENNOSIDES-DOCUSATE SODIUM 8.6-50 MG PO TABS
2.0000 | ORAL_TABLET | Freq: Every day | ORAL | Status: DC
  Administered 2022-05-13 – 2022-05-14 (×2): 2 via ORAL
  Filled 2022-05-12 (×2): qty 2

## 2022-05-12 MED ORDER — FENTANYL CITRATE (PF) 100 MCG/2ML IJ SOLN
100.0000 ug | INTRAMUSCULAR | Status: DC | PRN
  Administered 2022-05-12 (×2): 100 ug via INTRAVENOUS
  Filled 2022-05-12 (×2): qty 2

## 2022-05-12 MED ORDER — ACETAMINOPHEN 325 MG PO TABS: 650.0000 mg | ORAL_TABLET | ORAL | Status: DC | PRN

## 2022-05-12 MED ORDER — MAGNESIUM SULFATE 40 GM/1000ML IV SOLN
INTRAVENOUS | Status: AC
  Filled 2022-05-12: qty 1000

## 2022-05-12 MED ORDER — HYDRALAZINE HCL 20 MG/ML IJ SOLN: 10.0000 mg | INTRAMUSCULAR | Status: DC | PRN

## 2022-05-12 MED ORDER — LACTATED RINGERS IV SOLN: INTRAVENOUS | Status: DC

## 2022-05-12 MED ORDER — ONDANSETRON HCL 4 MG/2ML IJ SOLN: 4.0000 mg | Freq: Four times a day (QID) | INTRAMUSCULAR | Status: DC | PRN

## 2022-05-12 MED ORDER — TERBUTALINE SULFATE 1 MG/ML IJ SOLN: 0.2500 mg | Freq: Once | INTRAMUSCULAR | Status: DC | PRN

## 2022-05-12 MED ORDER — MEASLES, MUMPS & RUBELLA VAC IJ SOLR: 0.5000 mL | Freq: Once | INTRAMUSCULAR | Status: DC

## 2022-05-12 MED ORDER — COCONUT OIL OIL: 1.0000 | TOPICAL_OIL | Status: DC | PRN

## 2022-05-12 MED ORDER — MAGNESIUM SULFATE BOLUS VIA INFUSION
4.0000 g | Freq: Once | INTRAVENOUS | Status: AC
  Administered 2022-05-12: 4 g via INTRAVENOUS
  Filled 2022-05-12: qty 1000

## 2022-05-12 MED ORDER — DIBUCAINE (PERIANAL) 1 % EX OINT
1.0000 | TOPICAL_OINTMENT | CUTANEOUS | Status: DC | PRN
Start: 2022-05-12 — End: 2022-05-14

## 2022-05-12 NOTE — Lactation Note (Signed)
This note was copied from a baby's chart. Lactation Consultation Note  Patient Name: Jackie Brennan TAVWP'V Date: 05/12/2022 Reason for consult: Initial assessment;Mother's request;Difficult latch;Primapara;1st time breastfeeding;Breastfeeding assistance;Early term 37-38.6wks;Nipple pain/trauma Age:22 hours  P1, Early Term, Infant Female  LC entered the room. Baby was STS with mom. Mom states that baby recently breast fed. She says that baby was latching well, but the latch has become painful. LC showed mom how to sandwich the breast to get a deeper latch. LC also spoke with the RN about getting mom some coconut oil.   Mom is aware that she should feel a tug and not pinching or pain.   LC showed mom how to break the latch if baby is not on deely.   LC spoke with mom about pumping, feeding frequency, hand expression, cluster feeding, supply and demand, and LC services brochure.   Mom does not have a pump at home.   WIC referral was sent.   Mom states that she has no further questions or concerns.   LC encouraged mom to call for latch assistance when it is time to feed again.   Current Feeding Plan:  Breastfeed according to feeding cues 8+ times in 24 hours.  Hand express for added stimulation and feed expressed milk to baby via a spoon.  Call RN/LC for assistance with latch.   Maternal Data Has patient been taught Hand Expression?: Yes Does the patient have breastfeeding experience prior to this delivery?: No  Feeding    LATCH Score                    Lactation Tools Discussed/Used    Interventions Interventions: Breast feeding basics reviewed;Education;LC Services brochure;Hand express  Discharge [facility] Program: Yes  Consult Status      Jackie Brennan 05/12/2022, 2:22 PM

## 2022-05-12 NOTE — Discharge Summary (Signed)
Postpartum Discharge Summary  Date of Service updated***     Patient Name: Salah Burlison DOB: 2001/07/10 MRN: [id number]  Date of admission: 05/11/2022 Delivery date:05/12/2022  Delivering provider: Donnamae Jude  Date of discharge: 05/12/2022  Admitting diagnosis: Preeclampsia, third trimester [O14.93] Intrauterine pregnancy: [redacted]w[redacted]d     Secondary diagnosis:  Principal Problem:   Preeclampsia, third trimester  Additional problems: ***    Discharge diagnosis: {DX.:23714}                                              Post partum procedures:{Postpartum procedures:23558} Augmentation: {XBWIOMBTDHRC:16384} Complications: {OB Labor/Delivery Complications:20784}  Hospital course: {Courses:23701}  Magnesium Sulfate received: {Mag received:30440022} BMZ received: {BMZ received:30440023} Rhophylac:{Rhophylac received:30440032} TXM:{IWO:03212248} T-DaP:{Tdap:23962} Flu: {GNO:03704} Transfusion:{Transfusion received:30440034}  Physical exam  Vitals:   05/11/22 2318 05/11/22 2331 05/12/22 0158 05/12/22 0350  BP: (!) 145/90 (!) 141/96 (!) 147/96 (!) 145/92  Pulse: 88 83 90 79  Resp:      Temp:  98.3 F (36.8 C)    TempSrc:      SpO2:      Weight:      Height:       General: {Exam; general:21111117} Lochia: {Desc; appropriate/inappropriate:30686::"appropriate"} Uterine Fundus: {Desc; firm/soft:30687} Incision: {Exam; incision:21111123} DVT Evaluation: {Exam; dvt:2111122} Labs: Lab Results  Component Value Date   WBC 7.5 05/11/2022   HGB 10.8 (L) 05/11/2022   HCT 32.1 (L) 05/11/2022   MCV 86.8 05/11/2022   PLT 202 05/11/2022      Latest Ref Rng & Units 05/11/2022    8:40 PM  CMP  Glucose 70 - 99 mg/dL 118   BUN 6 - 20 mg/dL 11   Creatinine 0.44 - 1.00 mg/dL 1.06   Sodium 135 - 145 mmol/L 136   Potassium 3.5 - 5.1 mmol/L 3.8   Chloride 98 - 111 mmol/L 107   CO2 22 - 32 mmol/L 19   Calcium 8.9 - 10.3 mg/dL 9.1   Total Protein 6.5 - 8.1 g/dL 6.1   Total Bilirubin  0.3 - 1.2 mg/dL 0.6   Alkaline Phos 38 - 126 U/L 191   AST 15 - 41 U/L 21   ALT 0 - 44 U/L 14    Edinburgh Score:     No data to display           After visit meds:  Allergies as of 05/12/2022   No Known Allergies   Med Rec must be completed prior to using this [facility]***        Discharge home in stable condition Infant Feeding: {Baby feeding:23562} Infant Disposition:{CHL IP OB HOME WITH UGQBVQ:94503} Discharge instruction: per After Visit Summary and Postpartum booklet. Activity: Advance as tolerated. Pelvic rest for 6 weeks.  Diet: {OB UUEK:80034917} Future Appointments: Future Appointments  Date Time Provider [place]  05/18/2022 11:10 AM Janyth Pupa, DO [facility]  05/21/2022  2:50 PM [facility] NURSE [facility]  05/25/2022 11:10 AM Janyth Pupa, DO [facility]   Follow up Visit:   Please schedule this patient for a {Visit type:23955} postpartum visit in {Postpartum visit:23953} with the following provider: {Provider type:23954}. Additional Postpartum F/U:{PP Procedure:23957}  {Risk HXTAV:69794} pregnancy complicated by: {IAXKPVVZSMOL:07867} Delivery mode:  Vaginal, Spontaneous  Anticipated Birth Control:  {Birth Control:23956}   05/12/2022 Holley Bouche, MD

## 2022-05-12 NOTE — Lactation Note (Signed)
This note was copied from a baby's chart. Lactation Consultation Note  Patient Name: Jackie Brennan YWVPX'T Date: 05/12/2022 Reason for consult: Difficult latch;Mother's request;Primapara;Early term 48-38.6wks Age:22 hours  Mom concerned due to infant not feeding for a few hours.  Infant placed STS with mom but not cues.  LC reviewed hand expression and fed a few drops to baby.    After multiple attempts with baby latching then losing grasp, baby achieved latch in reclined football and gape was wide, lips flanged, swallows heard and mom was comfortable.    LC encouraged dad to help with positioning and support when needed.  Mom is on Mag and wanted to feed baby before sleeping.  She had several questions and all were addressed.    RN will set up DEBP for mom to pump for extra stimulation.  Encouraged STS, feed with cues, 8-12 feeds in 24, and call out for assistance if needed.      Maternal Data Has patient been taught Hand Expression?: Yes Does the patient have breastfeeding experience prior to this delivery?: No  Feeding Mother's Current Feeding Choice: Breast Milk  LATCH Score Latch: Repeated attempts needed to sustain latch, nipple held in mouth throughout feeding, stimulation needed to elicit sucking reflex.  Audible Swallowing: A few with stimulation  Type of Nipple: Everted at rest and after stimulation (edema around nipple/ reverse pressure softening used)  Comfort (Breast/Nipple): Filling, red/small blisters or bruises, mild/mod discomfort (mild discomfort on the right nipple)  Hold (Positioning): Assistance needed to correctly position infant at breast and maintain latch.  LATCH Score: 6   Lactation Tools Discussed/Used    Interventions Interventions: Breast feeding basics reviewed;Assisted with latch;Skin to skin;Breast massage;Support pillows;Adjust position;Breast compression  Discharge    Consult Status Consult Status: Follow-up Date:  05/13/22 Follow-up type: In-patient    Maryruth Hancock [facility] 05/12/2022, 7:51 PM

## 2022-05-12 NOTE — H&P (Addendum)
OBSTETRIC ADMISSION HISTORY AND PHYSICAL  Jackie Brennan is a 21 y.o. female G1P0000 with IUP at [redacted]w[redacted]d by ultrasound presenting for IOL for Pre-E. She reports +FMs, No LOF, no VB, no blurry vision, chest pain, SOB, or RUQ pain. Patient has had peripheral edema for last 2 weeks, and had a headache last night. She plans on breast feeding. She is unsure about  birth control, but plans to abstain after delivery. She received her prenatal care at [facility]   Dating: By ultrasound  --->  Estimated Date of Delivery: 05/29/22  Sono:    @[redacted]w[redacted]d , CWD, normal anatomy, breech presentation,  404 g, 82% EFW   Prenatal History/Complications:  Pre-E - Borderline BP with 4+ protein on urine dip 05/11/22 HSV2 - asymptomatic, on suppression  Past Medical History: Past Medical History:  Diagnosis Date   Asthma    Herpes simplex virus (HSV) infection     Past Surgical History: Past Surgical History:  Procedure Laterality Date   NO PAST SURGERIES      Obstetrical History: OB History     Gravida  1   Para  0   Term  0   Preterm  0   AB  0   Living  0      SAB  0   IAB  0   Ectopic  0   Multiple  0   Live Births  0           Social History Social History   Socioeconomic History   Marital status: Single    Spouse name: Not on file   Number of children: Not on file   Years of education: Not on file   Highest education level: Not on file  Occupational History   Not on file  Tobacco Use   Smoking status: Former    Types: Cigarettes   Smokeless tobacco: Never  Vaping Use   Vaping Use: Never used  Substance and Sexual Activity   Alcohol use: Never   Drug use: Never   Sexual activity: Yes    Birth control/protection: None  Other Topics Concern   Not on file  Social History Narrative   Not on file   Social Determinants of Health   Financial Resource Strain: Low Risk  (03/01/2022)   Overall Financial Resource Strain (CARDIA)    Difficulty of Paying Living  Expenses: Not hard at all  Food Insecurity: No Food Insecurity (03/01/2022)   Hunger Vital Sign    Worried About Running Out of Food in the Last Year: Never true    Ran Out of Food in the Last Year: Never true  Transportation Needs: No Transportation Needs (03/01/2022)   PRAPARE - 03/03/2022 (Medical): No    Lack of Transportation (Non-Medical): No  Physical Activity: Insufficiently Active (03/01/2022)   Exercise Vital Sign    Days of Exercise per Week: 3 days    Minutes of Exercise per Session: 30 min  Stress: No Stress Concern Present (03/01/2022)   03/03/2022 of Occupational Health - Occupational Stress Questionnaire    Feeling of Stress : Only a little  Social Connections: Socially Isolated (03/01/2022)   Social Connection and Isolation Panel [NHANES]    Frequency of Communication with Friends and Family: More than three times a week    Frequency of Social Gatherings with Friends and Family: Twice a week    Attends Religious Services: Never    03/03/2022 or Organizations: No  Attends Banker Meetings: Never    Marital Status: Never married    Family History: Family History  Problem Relation Age of Onset   Diabetes Father    Diabetes Maternal Grandmother        borderline   Arthritis Maternal Grandfather    Diabetes Paternal Grandmother    Arthritis Paternal Grandmother     Allergies: No Known Allergies  Medications Prior to Admission  Medication Sig Dispense Refill Last Dose   aspirin 81 MG EC tablet Take 1 tablet (81 mg total) by mouth daily. Swallow whole. 90 tablet 3 05/11/2022   Prenatal Vit-Fe Fumarate-FA (PREPLUS) 27-1 MG TABS Take 1 tablet by mouth daily. 90 tablet 3 05/11/2022   valACYclovir (VALTREX) 500 MG tablet TAKE 1 TABLET BY MOUTH DAILY 30 tablet 7 05/11/2022   Blood Pressure Monitor MISC For regular home bp monitoring during pregnancy 1 each 0      Review of Systems   All systems reviewed and  negative except as stated in HPI  Blood pressure (!) 141/96, pulse 83, temperature 98.3 F (36.8 C), resp. rate 18, height 5\' 2"  (1.575 m), weight 93 kg, last menstrual period 07/25/2021, SpO2 99 %. General appearance: alert, cooperative, appears stated age, and no distress Lungs: clear to auscultation bilaterally Heart: regular rate and rhythm Abdomen: soft, non-tender; bowel sounds normal Extremities: Homans sign is negative, no sign of DVT Presentation: cephalic Fetal monitoring: Baseline: 120 bpm, Variability: Good {> 6 bpm), Accelerations: Reactive, and Decelerations: Absent Uterine activity: Irregular Dilation: Closed Effacement (%): Thick Station: -3 Exam by:: [email or web address], CNM NO HSV LESIONS SEEN ON EXTERNAL OR SPECULUM EXAMS  Prenatal labs: ABO, Rh: --/--/B POS (07/07 2035) Antibody: NEG (07/07 2035) Rubella: 4.21 (01/20 1226) RPR: Non Reactive (04/27 0912)  HBsAg: Negative (01/20 1226)  HIV: Non Reactive (04/27 0912)  GBS: Positive/-- (06/30 1500)  1 hr Glucola: Normal Genetic screening:  Low Risk Anatomy 08-09-1985: Normal  Prenatal Transfer Tool  Maternal Diabetes: No Genetic Screening: Normal Maternal Ultrasounds/Referrals: Normal Fetal Ultrasounds or other Referrals:  None Maternal Substance Abuse:  No Significant Maternal Medications:  Meds include: Other: Valtrex Significant Maternal Lab Results: Group B Strep positive  Results for orders placed or performed during the hospital encounter of 05/11/22 (from the past 24 hour(s))  Type and screen [facility]   Collection Time: 05/11/22  8:35 PM  Result Value Ref Range   ABO/RH(D) B POS    Antibody Screen NEG    Sample Expiration      05/14/2022,2359 Performed at [facility], [address], [place], [place]   CBC   Collection Time: 05/11/22  8:40 PM  Result Value Ref Range   WBC 7.5 4.0 - 10.5 K/uL   RBC 3.70 (L) 3.87 - 5.11 MIL/uL   Hemoglobin 10.8 (L) 12.0 - 15.0 g/dL   HCT  07/12/22 (L) 60.7 - 46.0 %   MCV 86.8 80.0 - 100.0 fL   MCH 29.2 26.0 - 34.0 pg   MCHC 33.6 30.0 - 36.0 g/dL   RDW 37.1 06.2 - 69.4 %   Platelets 202 150 - 400 K/uL   nRBC 0.4 (H) 0.0 - 0.2 %  Comprehensive metabolic panel   Collection Time: 05/11/22  8:40 PM  Result Value Ref Range   Sodium 136 135 - 145 mmol/L   Potassium 3.8 3.5 - 5.1 mmol/L   Chloride 107 98 - 111 mmol/L   CO2 19 (L) 22 - 32 mmol/L  Glucose, Bld 118 (H) 70 - 99 mg/dL   BUN 11 6 - 20 mg/dL   Creatinine, Ser 4.81 (H) 0.44 - 1.00 mg/dL   Calcium 9.1 8.9 - 85.6 mg/dL   Total Protein 6.1 (L) 6.5 - 8.1 g/dL   Albumin 2.6 (L) 3.5 - 5.0 g/dL   AST 21 15 - 41 U/L   ALT 14 0 - 44 U/L   Alkaline Phosphatase 191 (H) 38 - 126 U/L   Total Bilirubin 0.6 0.3 - 1.2 mg/dL   GFR, Estimated >31 >49 mL/min   Anion gap 10 5 - 15  Protein / creatinine ratio, urine   Collection Time: 05/11/22  9:34 PM  Result Value Ref Range   Creatinine, Urine 361.70 mg/dL   Total Protein, Urine 693 mg/dL   Protein Creatinine Ratio 1.92 (H) 0.00 - 0.15 mg/mg[Cre]  Results for orders placed or performed in visit on 05/11/22 (from the past 24 hour(s))  POC Urinalysis Dipstick OB   Collection Time: 05/11/22  1:08 PM  Result Value Ref Range   Color, UA     Clarity, UA     Glucose, UA Negative Negative   Bilirubin, UA     Ketones, UA neg    Spec Grav, UA     Blood, UA neg    pH, UA     POC,PROTEIN,UA 4+ Negative, Trace, Small (1+), Moderate (2+), Large (3+), 4+   Urobilinogen, UA     Nitrite, UA neg    Leukocytes, UA Negative Negative   Appearance     Odor      Patient Active Problem List   Diagnosis Date Noted   Preeclampsia, third trimester 05/12/2022   Supervision of normal first pregnancy 11/24/2021   Herpes 11/24/2021   Acne 12/05/2010    Assessment/Plan:  Jackie Brennan is a 21 y.o. G1P0000 at [redacted]w[redacted]d here for IOL for Pre-E  #Labor:Patient cervical exam closed/thick/-3. Patient would benefit from cervical softening with  buccal Cytotec. Will reassess in 4 hours. 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  I have verified that all services and findings are accurately documented in this student's note; and I agree with management and plan as outlined in the documentation. I have also made any necessary editorial changes.  Thorough visual inspection of external genitalia as well as speculum exam performed to visualize vagina and cervix for HSV lesions -- no active lesions seen  Raelyn Mora, CNM Center for [employer], [facility] 05/12/2022 3:13 AM

## 2022-05-13 MED ORDER — NIFEDIPINE ER OSMOTIC RELEASE 30 MG PO TB24
30.0000 mg | ORAL_TABLET | Freq: Every day | ORAL | Status: DC
  Administered 2022-05-13: 30 mg via ORAL
  Filled 2022-05-13: qty 1

## 2022-05-13 NOTE — Lactation Note (Signed)
This note was copied from a baby's chart. Lactation Consultation Note  Patient Name: Jackie Brennan AJOIN'O Date: 05/13/2022 Reason for consult: Mother's request;Difficult latch;1st time breastfeeding;Early term 37-38.6wks Age:22 hours Mom's current feeding choice is breast and formula feeding.  Per mom, infant not been latching well at breast, LC unable to assist with latch at this time due infant recently receiving 20 mls of formula at 0100 am. Mom will continue to ask RN /LC for latch assistance and BF infant according to hunger cues, on demand, 8 to 12+ times within 24 hours, skin to skin. LC encouraged mom to continue to use DEBP every 3 hours for 15 minutes on initial setting to help stimulate and establish her milk supply.  Mom will continue to attempt to latch infant at breast, if infant doesn't latch she will continue to supplement infant with any EBM first and then formula. LC will inform next LC who will start shift at 0300 am mom would like latch assistance  per mom, infant's  next feeding is at 0400 am.   Maternal Data    Feeding Mother's Current Feeding Choice: Breast Milk and Formula Nipple Type: Slow - flow  LATCH Score                    Lactation Tools Discussed/Used    Interventions Interventions: Skin to skin;Position options;DEBP;Education  Discharge    Consult Status Consult Status: Follow-up Date: 05/13/22 Follow-up type: In-patient    Danelle Earthly 05/13/2022, 2:07 AM

## 2022-05-13 NOTE — Lactation Note (Signed)
This note was copied from a baby's chart. Lactation Consultation Note  Patient Name: Jackie Brennan JZPHX'T Date: 05/13/2022 Reason for consult: Follow-up assessment;Mother's request;Difficult latch;Early term 37-38.6wks;Breastfeeding assistance;Other (Comment) (PIH ( off MgSulfate) Lasix, Nifedipine) Age:22 hours  Infant recently had 30 ml of formula prior to [facility] arrival. Mom to call for latch assistance with next feeding.   Plan 1. To feed based on cues 8-12x 24hr period. Mom to offer breasts and look for signs of milk transfer.  2. Mom to supplement with EBM first followed by formula with pace bottle feeding and slow flow nipple. BF supplementation guide provided and reviewed.  3. Post pump after feeding for 15 mins.   All questions answered at the end of the visit.   Maternal Data Has patient been taught Hand Expression?: Yes  Feeding Mother's Current Feeding Choice: Breast Milk and Formula Nipple Type: Slow - flow  LATCH Score                    Lactation Tools Discussed/Used Tools: Pump;Flanges Flange Size: 24;27 Breast pump type: Double-Electric Breast Pump Pump Education: Setup, frequency, and cleaning;Milk Storage Reason for Pumping: increase stimulation Pumping frequency: Post pump aftger feeding for 15 min  Interventions Interventions: Breast feeding basics reviewed;Expressed milk;DEBP;Education;Pace feeding;Scientist, research (physical sciences)  Discharge Pump: DEBP;Manual WIC Program: Yes  Consult Status Consult Status: Follow-up Date: 05/14/22 Follow-up type: In-patient    Jackie Brennan  Jackie Brennan 05/13/2022, 1:15 PM

## 2022-05-13 NOTE — Progress Notes (Signed)
Post Partum Day 1 Subjective: no complaints  Objective: Blood pressure 134/78, pulse 87, temperature 97.9 F (36.6 C), temperature source Oral, resp. rate 18, height 5\' 2"  (1.575 m), weight 93 kg, last menstrual period 07/25/2021, SpO2 100 %, unknown if currently breastfeeding.  Physical Exam:  General: alert, cooperative, and no distress Lochia: appropriate Uterine Fundus: firm DVT Evaluation: No evidence of DVT seen on physical exam.  Recent Labs    05/12/22 0746 05/12/22 1649  HGB 11.2* 9.1*  HCT 33.8* 27.6*  CBC    Component Value Date/Time   WBC 12.8 (H) 05/12/2022 1649   RBC 3.12 (L) 05/12/2022 1649   HGB 9.1 (L) 05/12/2022 1649   HGB 11.1 03/01/2022 0912   HCT 27.6 (L) 05/12/2022 1649   HCT 32.6 (L) 03/01/2022 0912   PLT 222 05/12/2022 1649   PLT 208 03/01/2022 0912   MCV 88.5 05/12/2022 1649   MCV 88 03/01/2022 0912   MCH 29.2 05/12/2022 1649   MCHC 33.0 05/12/2022 1649   RDW 13.7 05/12/2022 1649   RDW 13.3 03/01/2022 0912   LYMPHSABS 1.6 11/24/2021 1226   EOSABS 0.1 11/24/2021 1226   BASOSABS 0.0 11/24/2021 1226     Assessment/Plan: Procardia 30 mg XL added Magnesium d/c   LOS: 2 days   11/26/2021, MD 05/13/2022, 7:55 AM

## 2022-05-14 MED ORDER — FERROUS SULFATE 325 (65 FE) MG PO TABS
325.0000 mg | ORAL_TABLET | ORAL | 1 refills | Status: AC
Start: 2022-05-14 — End: ?

## 2022-05-14 MED ORDER — NIFEDIPINE ER 30 MG PO TB24: 30.0000 mg | ORAL_TABLET | Freq: Two times a day (BID) | ORAL | 0 refills | Status: AC

## 2022-05-14 MED ORDER — NIFEDIPINE ER OSMOTIC RELEASE 30 MG PO TB24
30.0000 mg | ORAL_TABLET | Freq: Two times a day (BID) | ORAL | Status: DC
  Administered 2022-05-14: 30 mg via ORAL
  Filled 2022-05-14: qty 1

## 2022-05-14 MED ORDER — IBUPROFEN 600 MG PO TABS: 600.0000 mg | ORAL_TABLET | Freq: Four times a day (QID) | ORAL | 0 refills | Status: AC

## 2022-05-14 MED ORDER — FUROSEMIDE 20 MG PO TABS: 20.0000 mg | ORAL_TABLET | Freq: Every day | ORAL | 0 refills | Status: AC

## 2022-05-14 NOTE — Lactation Note (Signed)
This note was copied from a baby's chart. Lactation Consultation Note  Patient Name: Jackie Brennan [id number] Date: 05/14/2022 Reason for consult: Follow-up assessment;Difficult latch;Primapara;1st time breastfeeding;Exclusive pumping and bottle feeding;Infant weight loss;Breastfeeding assistance (5.37% WL) Age:22 hours  P1, Early Term, Infant Female, 5.37% WL  LC entered the room and baby was asleep in the bassinet. Per mom, she and dad have decided to formula feed only. Mom states that pumping will be too much for her when she returns to work.   LC spoke with mom about breast care and engorgement.   Mom asked about drying up the milk. LC encouraged her to use cabbage leaves, a supportive bra, and to avoid expressing the milk. LC did inform mom that she could hand express if she needed some relief, but avoid expressing milk if possible.   All questions were answered during this visit and mom states that she has no further questions or concerns.   Feeding Nipple Type: Extra Slow Flow   Consult Status Consult Status: Complete (mother declined follow up) Date: 05/14/22 Follow-up type: Call as needed    [employer] 05/14/2022, 11:17 AM

## 2022-05-14 NOTE — Progress Notes (Signed)
   05/14/22 1410  Departure Condition  Departure Condition Good  Mobility at [facility]  Patient/Caregiver Teaching Teach Back Method Used;Discharge instructions reviewed;Prescriptions reviewed;Follow-up care reviewed;Pain management discussed;Medications discussed;Patient/caregiver verbalized understanding;Educated about hypertension in pregnancy  Departure Mode With significant other  Was procedural sedation performed on this patient during this visit? No   Patient alert and oriented x4, ambulatory, and VS and pain stable.

## 2022-05-14 NOTE — Plan of Care (Signed)
  Problem: Health Behavior/Discharge Planning: Goal: Ability to manage health-related needs will improve Outcome: Adequate for Discharge   Problem: Clinical Measurements: Goal: Ability to maintain clinical measurements within normal limits will improve Outcome: Adequate for Discharge Goal: Will remain free from infection Outcome: Adequate for Discharge Goal: Diagnostic test results will improve Outcome: Adequate for Discharge Goal: Respiratory complications will improve Outcome: Adequate for Discharge Goal: Cardiovascular complication will be avoided Outcome: Adequate for Discharge   Problem: Nutrition: Goal: Adequate nutrition will be maintained Outcome: Adequate for Discharge   Problem: Elimination: Goal: Will not experience complications related to bowel motility Outcome: Adequate for Discharge   Problem: Education: Goal: Knowledge of condition will improve Outcome: Adequate for Discharge Goal: Individualized Educational Video(s) Outcome: Adequate for Discharge Goal: Individualized Newborn Educational Video(s) Outcome: Adequate for Discharge   Problem: Activity: Goal: Will verbalize the importance of balancing activity with adequate rest periods Outcome: Adequate for Discharge Goal: Ability to tolerate increased activity will improve Outcome: Adequate for Discharge   Problem: Coping: Goal: Ability to identify and utilize available resources and services will improve Outcome: Adequate for Discharge   Problem: Life Cycle: Goal: Chance of risk for complications during the postpartum period will decrease Outcome: Adequate for Discharge   Problem: Education: Goal: Knowledge of disease or condition will improve Outcome: Adequate for Discharge Goal: Knowledge of the prescribed therapeutic regimen will improve Outcome: Adequate for Discharge   Problem: Fluid Volume: Goal: Peripheral tissue perfusion will improve Outcome: Adequate for Discharge   Problem: Clinical  Measurements: Goal: Complications related to disease process, condition or treatment will be avoided or minimized Outcome: Adequate for Discharge

## 2022-05-18 ENCOUNTER — Encounter: Payer: Medicaid Other | Admitting: Obstetrics & Gynecology

## 2022-05-21 ENCOUNTER — Encounter: Payer: Self-pay | Admitting: *Deleted

## 2022-05-21 ENCOUNTER — Ambulatory Visit (INDEPENDENT_AMBULATORY_CARE_PROVIDER_SITE_OTHER): Payer: Medicaid Other | Admitting: *Deleted

## 2022-05-21 VITALS — BP 119/86 | HR 95 | Ht 62.0 in | Wt 172.0 lb

## 2022-05-21 DIAGNOSIS — Z0130 Encounter for examination of blood pressure without abnormal findings: Secondary | ICD-10-CM

## 2022-05-21 NOTE — Progress Notes (Signed)
   NURSE VISIT- BLOOD PRESSURE CHECK  SUBJECTIVE:  Jackie Brennan is a 21 y.o. [id number] female here for BP check. She is postpartum, delivery date 05/12/22.     HYPERTENSION ROS:  Pregnant/postpartum:  Severe headaches that don't go away with tylenol/other medicines: No  Visual changes (seeing spots/double/blurred vision) No  Severe pain under right breast breast or in center of upper chest No  Severe nausea/vomiting No  Taking medicines as instructed yes   OBJECTIVE:  BP 119/86 (BP Location: Left Arm, Patient Position: Sitting, Cuff Size: Normal)   Pulse 95   Ht 5\' 2"  (1.575 m)   Wt 172 lb (78 kg)   LMP 07/25/2021   Breastfeeding No   BMI 31.46 kg/m   Appearance alert, well appearing, and in no distress.  ASSESSMENT: Postpartum  blood pressure check  PLAN: Discussed with Dr. 07/27/2021   Recommendations: no changes needed; stop BP med a few days before postpartum visit.   Follow-up:  for postpartum visit.     Charlotta Newton  05/21/2022 3:30 PM

## 2022-05-25 ENCOUNTER — Encounter: Payer: Medicaid Other | Admitting: Obstetrics & Gynecology

## 2022-06-18 ENCOUNTER — Ambulatory Visit: Payer: Medicaid Other | Admitting: Advanced Practice Midwife

## 2022-07-08 ENCOUNTER — Other Ambulatory Visit: Payer: Self-pay | Admitting: Adult Health
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
